# Patient Record
Sex: Male | Born: 1945 | Hispanic: No | Marital: Single | Smoking: Never smoker
Health system: Southern US, Community
[De-identification: ages and names within clinical notes are randomized; demographics above are authoritative.]

## PROBLEM LIST (undated history)

## (undated) DIAGNOSIS — I639 Cerebral infarction, unspecified: Secondary | ICD-10-CM

## (undated) DIAGNOSIS — E7800 Pure hypercholesterolemia, unspecified: Secondary | ICD-10-CM

## (undated) HISTORY — DX: Cerebral infarction, unspecified: I63.9

---

## 2000-08-12 ENCOUNTER — Emergency Department (HOSPITAL_COMMUNITY): Admission: EM | Admit: 2000-08-12 | Discharge: 2000-08-13 | Payer: Self-pay | Admitting: Emergency Medicine

## 2003-07-06 ENCOUNTER — Ambulatory Visit (HOSPITAL_COMMUNITY): Admission: RE | Admit: 2003-07-06 | Discharge: 2003-07-06 | Payer: Self-pay | Admitting: Family Medicine

## 2009-12-11 ENCOUNTER — Encounter: Admission: RE | Admit: 2009-12-11 | Discharge: 2009-12-11 | Payer: Self-pay | Admitting: Otolaryngology

## 2013-04-13 DIAGNOSIS — Z0000 Encounter for general adult medical examination without abnormal findings: Secondary | ICD-10-CM | POA: Diagnosis not present

## 2013-04-13 DIAGNOSIS — E785 Hyperlipidemia, unspecified: Secondary | ICD-10-CM | POA: Diagnosis not present

## 2013-04-13 DIAGNOSIS — K219 Gastro-esophageal reflux disease without esophagitis: Secondary | ICD-10-CM | POA: Diagnosis not present

## 2013-04-13 DIAGNOSIS — Z125 Encounter for screening for malignant neoplasm of prostate: Secondary | ICD-10-CM | POA: Diagnosis not present

## 2013-07-21 DIAGNOSIS — E785 Hyperlipidemia, unspecified: Secondary | ICD-10-CM | POA: Diagnosis not present

## 2014-07-06 DIAGNOSIS — Z125 Encounter for screening for malignant neoplasm of prostate: Secondary | ICD-10-CM | POA: Diagnosis not present

## 2014-07-06 DIAGNOSIS — D696 Thrombocytopenia, unspecified: Secondary | ICD-10-CM | POA: Diagnosis not present

## 2014-07-06 DIAGNOSIS — Z23 Encounter for immunization: Secondary | ICD-10-CM | POA: Diagnosis not present

## 2014-07-06 DIAGNOSIS — E780 Pure hypercholesterolemia: Secondary | ICD-10-CM | POA: Diagnosis not present

## 2014-07-06 DIAGNOSIS — Z0000 Encounter for general adult medical examination without abnormal findings: Secondary | ICD-10-CM | POA: Diagnosis not present

## 2014-07-06 DIAGNOSIS — K219 Gastro-esophageal reflux disease without esophagitis: Secondary | ICD-10-CM | POA: Diagnosis not present

## 2014-10-18 DIAGNOSIS — E782 Mixed hyperlipidemia: Secondary | ICD-10-CM | POA: Diagnosis not present

## 2014-11-07 ENCOUNTER — Encounter (HOSPITAL_COMMUNITY): Payer: Self-pay | Admitting: *Deleted

## 2014-11-07 ENCOUNTER — Inpatient Hospital Stay (HOSPITAL_COMMUNITY)
Admission: EM | Admit: 2014-11-07 | Discharge: 2014-11-09 | DRG: 066 | Disposition: A | Discharge status: Home / Self Care | Payer: Medicare Other | Attending: Internal Medicine | Admitting: Internal Medicine

## 2014-11-07 ENCOUNTER — Emergency Department (HOSPITAL_COMMUNITY): Payer: Medicare Other

## 2014-11-07 ENCOUNTER — Inpatient Hospital Stay (HOSPITAL_COMMUNITY): Payer: Medicare Other

## 2014-11-07 DIAGNOSIS — R739 Hyperglycemia, unspecified: Secondary | ICD-10-CM | POA: Diagnosis present

## 2014-11-07 DIAGNOSIS — I639 Cerebral infarction, unspecified: Principal | ICD-10-CM | POA: Diagnosis present

## 2014-11-07 DIAGNOSIS — R29810 Facial weakness: Secondary | ICD-10-CM | POA: Diagnosis present

## 2014-11-07 DIAGNOSIS — W19XXXA Unspecified fall, initial encounter: Secondary | ICD-10-CM | POA: Diagnosis not present

## 2014-11-07 DIAGNOSIS — Z9109 Other allergy status, other than to drugs and biological substances: Secondary | ICD-10-CM

## 2014-11-07 DIAGNOSIS — E7800 Pure hypercholesterolemia, unspecified: Secondary | ICD-10-CM | POA: Diagnosis present

## 2014-11-07 DIAGNOSIS — I6789 Other cerebrovascular disease: Secondary | ICD-10-CM | POA: Diagnosis not present

## 2014-11-07 DIAGNOSIS — I10 Essential (primary) hypertension: Secondary | ICD-10-CM | POA: Diagnosis present

## 2014-11-07 DIAGNOSIS — I63312 Cerebral infarction due to thrombosis of left middle cerebral artery: Secondary | ICD-10-CM | POA: Diagnosis not present

## 2014-11-07 DIAGNOSIS — E785 Hyperlipidemia, unspecified: Secondary | ICD-10-CM | POA: Diagnosis present

## 2014-11-07 DIAGNOSIS — I6621 Occlusion and stenosis of right posterior cerebral artery: Secondary | ICD-10-CM | POA: Diagnosis not present

## 2014-11-07 DIAGNOSIS — I739 Peripheral vascular disease, unspecified: Secondary | ICD-10-CM | POA: Diagnosis present

## 2014-11-07 DIAGNOSIS — I6611 Occlusion and stenosis of right anterior cerebral artery: Secondary | ICD-10-CM | POA: Diagnosis not present

## 2014-11-07 HISTORY — DX: Pure hypercholesterolemia, unspecified: E78.00

## 2014-11-07 LAB — CBC
HEMATOCRIT: 48.7 % (ref 39.0–52.0)
HEMOGLOBIN: 16.5 g/dL (ref 13.0–17.0)
MCH: 31.6 pg (ref 26.0–34.0)
MCHC: 33.9 g/dL (ref 30.0–36.0)
MCV: 93.3 fL (ref 78.0–100.0)
Platelets: 141 10*3/uL — ABNORMAL LOW (ref 150–400)
RBC: 5.22 MIL/uL (ref 4.22–5.81)
RDW: 12.7 % (ref 11.5–15.5)
WBC: 4.7 10*3/uL (ref 4.0–10.5)

## 2014-11-07 LAB — DIFFERENTIAL
BASOS ABS: 0 10*3/uL (ref 0.0–0.1)
Basophils Relative: 0 % (ref 0–1)
EOS ABS: 0 10*3/uL (ref 0.0–0.7)
Eosinophils Relative: 1 % (ref 0–5)
LYMPHS ABS: 1.1 10*3/uL (ref 0.7–4.0)
LYMPHS PCT: 23 % (ref 12–46)
Monocytes Absolute: 0.2 10*3/uL (ref 0.1–1.0)
Monocytes Relative: 5 % (ref 3–12)
NEUTROS ABS: 3.3 10*3/uL (ref 1.7–7.7)
NEUTROS PCT: 71 % (ref 43–77)

## 2014-11-07 LAB — I-STAT TROPONIN, ED: Troponin i, poc: 0 ng/mL (ref 0.00–0.08)

## 2014-11-07 LAB — COMPREHENSIVE METABOLIC PANEL
ALBUMIN: 4.4 g/dL (ref 3.5–5.0)
ALT: 35 U/L (ref 17–63)
AST: 23 U/L (ref 15–41)
Alkaline Phosphatase: 73 U/L (ref 38–126)
Anion gap: 9 (ref 5–15)
BILIRUBIN TOTAL: 0.7 mg/dL (ref 0.3–1.2)
BUN: 11 mg/dL (ref 6–20)
CO2: 27 mmol/L (ref 22–32)
CREATININE: 0.84 mg/dL (ref 0.61–1.24)
Calcium: 9.7 mg/dL (ref 8.9–10.3)
Chloride: 104 mmol/L (ref 101–111)
GFR calc Af Amer: >60 mL/min (ref >60)
GFR calc non Af Amer: >60 mL/min (ref >60)
GLUCOSE: 141 mg/dL — AB (ref 65–99)
POTASSIUM: 3.8 mmol/L (ref 3.5–5.1)
Sodium: 140 mmol/L (ref 135–145)
TOTAL PROTEIN: 6.7 g/dL (ref 6.5–8.1)

## 2014-11-07 LAB — I-STAT CHEM 8, ED
BUN: 14 mg/dL (ref 6–20)
CREATININE: 0.8 mg/dL (ref 0.61–1.24)
Calcium, Ion: 1.23 mmol/L (ref 1.13–1.30)
Chloride: 101 mmol/L (ref 101–111)
Glucose, Bld: 139 mg/dL — ABNORMAL HIGH (ref 65–99)
HEMATOCRIT: 49 % (ref 39.0–52.0)
Hemoglobin: 16.7 g/dL (ref 13.0–17.0)
POTASSIUM: 3.9 mmol/L (ref 3.5–5.1)
Sodium: 140 mmol/L (ref 135–145)
TCO2: 26 mmol/L (ref 0–100)

## 2014-11-07 LAB — PROTIME-INR
INR: 1.02 (ref 0.00–1.49)
PROTHROMBIN TIME: 13.6 s (ref 11.6–15.2)

## 2014-11-07 LAB — APTT: aPTT: 27 seconds (ref 24–37)

## 2014-11-07 MED ORDER — ENOXAPARIN SODIUM 40 MG/0.4ML ~~LOC~~ SOLN
40.0000 mg | SUBCUTANEOUS | Status: DC
  Administered 2014-11-07 – 2014-11-08 (×2): 40 mg via SUBCUTANEOUS
  Filled 2014-11-07 (×2): qty 0.4

## 2014-11-07 MED ORDER — STROKE: EARLY STAGES OF RECOVERY BOOK
Freq: Once | Status: AC
  Administered 2014-11-07: 18:00:00

## 2014-11-07 MED ORDER — ASPIRIN 325 MG PO TABS: 325.0000 mg | ORAL_TABLET | Freq: Every day | ORAL | Status: DC

## 2014-11-07 MED ORDER — PNEUMOCOCCAL VAC POLYVALENT 25 MCG/0.5ML IJ INJ
0.5000 mL | INJECTION | INTRAMUSCULAR | Status: DC
  Filled 2014-11-07: qty 0.5

## 2014-11-07 MED ORDER — ACETAMINOPHEN 650 MG RE SUPP: 650.0000 mg | RECTAL | Status: DC | PRN

## 2014-11-07 MED ORDER — ASPIRIN 300 MG RE SUPP
300.0000 mg | Freq: Every day | RECTAL | Status: DC
  Administered 2014-11-07 – 2014-11-08 (×2): 300 mg via RECTAL
  Filled 2014-11-07 (×2): qty 1

## 2014-11-07 MED ORDER — ACETAMINOPHEN 325 MG PO TABS: 650.0000 mg | ORAL_TABLET | ORAL | Status: DC | PRN

## 2014-11-07 NOTE — ED Provider Notes (Signed)
CSN: [id number]     Arrival date & time 11/07/14  0945 History   First MD Initiated Contact with Patient 11/07/14 1049     Chief Complaint  Patient presents with  . Stroke Symptoms     (Consider location/radiation/quality/duration/timing/severity/associated sxs/prior Treatment) HPI Comments: Patient presents to the ER for evaluation of possible stroke symptoms. Patient has been experiencing symptoms for 3-5 days. Over this period of time his wife has noticed that the right side of his face was drooping and he has had some slurred speech. He reports that he has to concentrate when he swallows because sometimes he chokes. He also has noticed difficulty writing. He does not perceive any weakness of his extremities, but he is having difficulty with fine motor function such as writing. He has not noticed any sensory changes in his extremities.   Past Medical History  Diagnosis Date  . High cholesterol    History reviewed. No pertinent past surgical history. History reviewed. No pertinent family history. Social History  Substance Use Topics  . Smoking status: Never Smoker   . Smokeless tobacco: None  . Alcohol Use: No    Review of Systems  Neurological: Positive for facial asymmetry.  All other systems reviewed and are negative.     Allergies  Lipitor  Home Medications   Prior to Admission medications   Not on File   BP 144/75 mmHg  Pulse 58  Temp(Src) 98.2 F (36.8 C) (Oral)  Resp 11  Ht  (1.651 m)  Wt 165 lb 14.4 oz (75.252 kg)  BMI 27.61 kg/m2  SpO2 99% Physical Exam  Constitutional: He is oriented to person, place, and time. He appears well-developed and well-nourished. No distress.  HENT:  Head: Normocephalic and atraumatic.  Right Ear: Hearing normal.  Left Ear: Hearing normal.  Nose: Nose normal.  Mouth/Throat: Oropharynx is clear and moist and mucous membranes are normal.  Eyes: Conjunctivae and EOM are normal. Pupils are equal, round, and reactive to  light.  Neck: Normal range of motion. Neck supple.  Cardiovascular: Regular rhythm, S1 normal and S2 normal.  Exam reveals no gallop and no friction rub.   No murmur heard. Pulmonary/Chest: Effort normal and breath sounds normal. No respiratory distress. He exhibits no tenderness.  Abdominal: Soft. Normal appearance and bowel sounds are normal. There is no hepatosplenomegaly. There is no tenderness. There is no rebound, no guarding, no tenderness at McBurney's point and negative Murphy's sign. No hernia.  Musculoskeletal: Normal range of motion.  Neurological: He is alert and oriented to person, place, and time. He has normal strength. A cranial nerve deficit is present. No sensory deficit. Coordination normal. GCS eye subscore is 4. GCS verbal subscore is 5. GCS motor subscore is 6.  Right facial droop Tongue midline  Extraocular muscle movement: normal No visual field cut Pupils: equal and reactive both direct and consensual response is normal No nystagmus present    Sensory function is intact to light touch, pinprick Proprioception intact  Grip strength 5/5 symmetric in upper extremities No pronator drift Normal finger to nose bilaterally  Lower extremity strength 5/5 against gravity Normal heel to shin bilaterally  Gait: normal   Skin: Skin is warm, dry and intact. No rash noted. No cyanosis.  Psychiatric: He has a normal mood and affect. His speech is normal and behavior is normal. Thought content normal.  Nursing note and vitals reviewed.   ED Course  Procedures (including critical care time) Labs Review Labs Reviewed  CBC -  Abnormal; Notable for the following:    Platelets 141 (*)    All other components within normal limits  COMPREHENSIVE METABOLIC PANEL - Abnormal; Notable for the following:    Glucose, Bld 141 (*)    All other components within normal limits  I-STAT CHEM 8, ED - Abnormal; Notable for the following:    Glucose, Bld 139 (*)    All other components  within normal limits  PROTIME-INR  APTT  DIFFERENTIAL  Rosezena Sensor, ED    Imaging Review Mr Brain Wo Contrast  11/07/2014   CLINICAL DATA:  69 year old male with high cholesterol presenting with slurred speech, facial droop and difficulty writing for 3-4 days. Initial encounter.  EXAM: MRI HEAD WITHOUT CONTRAST  TECHNIQUE: Multiplanar, multiecho pulse sequences of the brain and surrounding structures were obtained without intravenous contrast.  COMPARISON:  None.  FINDINGS: Acute nonhemorrhagic infarct mid left corona radiata extending to superior aspect of the left lenticular nucleus.  Scattered punctate and patchy white matter type changes most consistent with result small vessel disease.  Mild global atrophy. Ventricular prominence may be related to atrophy although cannot exclude a mild component of hydrocephalus. The aqueduct is patent.  No intracranial mass lesion noted on this unenhanced exam.  No intracranial hemorrhage.  Major intracranial vascular structures are patent. Suggestion of dilated right internal carotid artery cavernous segment.  Cervical medullary junction, pituitary region, pineal region and orbital structures unremarkable.  Mild mucosal thickening inferior aspect maxillary sinuses. Minimal mucosal thickening ethmoid sinus air cells.  IMPRESSION: Acute nonhemorrhagic infarct mid left corona radiata extending to superior aspect of the left lenticular nucleus.  Scattered punctate and patchy white matter type changes most consistent with result small vessel disease.  Mild global atrophy. Ventricular prominence may be related to atrophy although cannot exclude a mild component of hydrocephalus. The aqueduct is patent.  Major intracranial vascular structures are patent. Suggestion of dilated right internal carotid artery cavernous segment.  Mild mucosal thickening inferior aspect maxillary sinuses. Minimal mucosal thickening ethmoid sinus air cells.   Electronically Signed   By:  Lacy Duverney M.D.   On: 11/07/2014 13:39   I have personally reviewed and evaluated these images and lab results as part of my medical decision-making.   EKG Interpretation None      MDM   Final diagnoses:  CVA (cerebral vascular accident)    Presents to the ER for evaluation of right facial droop and right upper extremity coordination problems. Symptoms ongoing for several days, possibly up to a week. He does have subtle right facial drooping on examination. He reports some difficulty with swallowing. He did not have any other objective findings. MRI reveals acute CVA.    Gilda Crease, MD 11/08/14 [phone]

## 2014-11-07 NOTE — Progress Notes (Signed)
Patient arrived to 5M18. Patient is alert and oriented x4, appropriate cognition, denies pain. Skin intact, no skin issues. VSS. SLP bedside swallow evaluation placed, per ED RN, patient failed RN stroke swallow screen due to occasionally choking on fluids in past week. Patient's family at bedside, oriented to room, unit, staff. Q2 neuro checks and vitals started. Will continue to monitor. Safety measures in place.

## 2014-11-07 NOTE — H&P (Signed)
[facility] History and Physical  Livio Ledwith HKG:[id number] DOB: [birth year] DOA: 11/07/2014   PCP: Vena Austria, MD  Specialists: None  Chief Complaint: Difficulty with speaking and difficulty with swallowing liquids since last week  HPI: Thomas French is a 69 y.o. male with a past medical history of hypercholesterolemia, who denies any other health problems and was in his usual state of health till sometime last week when he noticed that he was not able to speak as fluently as he was before. He had difficulty speaking certain words. He also noticed that his handwriting was not his usual. He was writing erratically. And then his wife also noticed that his face looked asymmetrical. Patient also noticed that he was having difficulty swallowing liquids, especially when he would try to swallow them quickly. He decided to wait out the symptoms and called his primary care physician today. He was asked to come in to the emergency department for further evaluation. He denies any headaches, chest pain, shortness of breath, nausea, vomiting. Denies any weakness in his arms or legs. He's never had similar symptoms before. He feels as if his speech has improved but not quite back to his baseline.  Home Medications: Prior to Admission medications   Medication Sig Start Date End Date Taking? Authorizing Provider  pravastatin (PRAVACHOL) 40 MG tablet Take 40 mg by mouth daily.   Yes Historical Provider, MD    Allergies:  Allergies  Allergen Reactions  . Lipitor [Atorvastatin]     Past Medical History: Past Medical History  Diagnosis Date  . High cholesterol     History reviewed. No pertinent past surgical history.  Social History: Lives in [place] with his wife. Denies smoking. Occasional alcohol use. No illicit drug use. 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 Usually independent with daily activities.   Family History:  Family History  Problem Relation Age  of Onset  . Diabetes Father      Review of Systems - History obtained from the patient General ROS: positive for  - fatigue Psychological ROS: negative Ophthalmic ROS: negative ENT ROS: negative Allergy and Immunology ROS: negative Hematological and Lymphatic ROS: negative Endocrine ROS: negative Respiratory ROS: no cough, shortness of breath, or wheezing Cardiovascular ROS: no chest pain or dyspnea on exertion Gastrointestinal ROS: no abdominal pain, change in bowel habits, or black or bloody stools Genito-Urinary ROS: no dysuria, trouble voiding, or hematuria Musculoskeletal ROS: negative Neurological ROS: as in hpi Dermatological ROS: negative  Physical Examination  Filed Vitals:   11/07/14 1415 11/07/14 1430 11/07/14 1500 11/07/14 1515  BP: 135/77 148/69 141/76 151/80  Pulse: 55 54 53 55  Temp:      TempSrc:      Resp: _0 Height:      Weight:      SpO2: 98% 98% 99% 98%    BP 151/80 mmHg  Pulse 55  Temp(Src) 98.2 F (36.8 C) (Oral)  Resp 16  Ht 5' 5" (1.651 m)  Wt 75.252 kg (165 lb 14.4 oz)  BMI 27.61 kg/m2  SpO2 98%  General appearance: alert, cooperative, appears stated age and no distress Head: Normocephalic, without obvious abnormality, atraumatic Eyes: conjunctivae/corneas clear. PERRL, EOM's intact.  Throat: lips, mucosa, and tongue normal; teeth and gums normal Neck: no adenopathy, no carotid bruit, no JVD, supple, symmetrical, trachea midline and thyroid not enlarged, symmetric, no tenderness/mass/nodules Resp: clear to auscultation bilaterally Cardio: regular rate and rhythm, S1, S2 normal, no murmur, click,  rub or gallop GI: soft, non-tender; bowel sounds normal; no masses,  no organomegaly Extremities: extremities normal, atraumatic, no cyanosis or edema Pulses: 2+ and symmetric Skin: Skin color, texture, turgor normal. No rashes or lesions Lymph nodes: Cervical, supraclavicular, and axillary nodes normal. Neurologic: Alert and oriented  3. Right facial droop is noted. Other cranial nerves are intact. No pronator drift. Motor strength is 55 bilateral upper and lower extremities. Gait not assessed. Sensation is intact.  Laboratory Data: Results for orders placed or performed during the hospital encounter of 11/07/14 (from the past 48 hour(s))  I-stat troponin, ED (not at [facility], [facility])     Status: None   Collection Time: 11/07/14 10:58 AM  Result Value Ref Range   Troponin i, poc 0.00 0.00 - 0.08 ng/mL   Comment 3            Comment: Due to the release kinetics of cTnI, a negative result within the first hours of the onset of symptoms does not rule out myocardial infarction with certainty. If myocardial infarction is still suspected, repeat the test at appropriate intervals.   Protime-INR     Status: None   Collection Time: 11/07/14 10:59 AM  Result Value Ref Range   Prothrombin Time 13.6 11.6 - 15.2 seconds   INR 1.02 0.00 - 1.49  APTT     Status: None   Collection Time: 11/07/14 10:59 AM  Result Value Ref Range   aPTT 27 24 - 37 seconds  CBC     Status: Abnormal   Collection Time: 11/07/14 10:59 AM  Result Value Ref Range   WBC 4.7 4.0 - 10.5 K/uL   RBC 5.22 4.22 - 5.81 MIL/uL   Hemoglobin 16.5 13.0 - 17.0 g/dL   HCT 48.7 39.0 - 52.0 %   MCV 93.3 78.0 - 100.0 fL   MCH 31.6 26.0 - 34.0 pg   MCHC 33.9 30.0 - 36.0 g/dL   RDW 12.7 11.5 - 15.5 %   Platelets 141 (L) 150 - 400 K/uL  Differential     Status: None   Collection Time: 11/07/14 10:59 AM  Result Value Ref Range   Neutrophils Relative % 71 43 - 77 %   Neutro Abs 3.3 1.7 - 7.7 K/uL   Lymphocytes Relative 23 12 - 46 %   Lymphs Abs 1.1 0.7 - 4.0 K/uL   Monocytes Relative 5 3 - 12 %   Monocytes Absolute 0.2 0.1 - 1.0 K/uL   Eosinophils Relative 1 0 - 5 %   Eosinophils Absolute 0.0 0.0 - 0.7 K/uL   Basophils Relative 0 0 - 1 %   Basophils Absolute 0.0 0.0 - 0.1 K/uL  Comprehensive metabolic panel     Status: Abnormal   Collection Time: 11/07/14 10:59 AM   Result Value Ref Range   Sodium 140 135 - 145 mmol/L   Potassium 3.8 3.5 - 5.1 mmol/L   Chloride 104 101 - 111 mmol/L   CO2 27 22 - 32 mmol/L   Glucose, Bld 141 (H) 65 - 99 mg/dL   BUN 11 6 - 20 mg/dL   Creatinine, Ser 0.84 0.61 - 1.24 mg/dL   Calcium 9.7 8.9 - 10.3 mg/dL   Total Protein 6.7 6.5 - 8.1 g/dL   Albumin 4.4 3.5 - 5.0 g/dL   AST 23 15 - 41 U/L   ALT 35 17 - 63 U/L   Alkaline Phosphatase 73 38 - 126 U/L   Total Bilirubin 0.7 0.3 - 1.2 mg/dL  GFR calc non Af Amer >60 >60 mL/min   GFR calc Af Amer >60 >60 mL/min    Comment: (NOTE) The eGFR has been calculated using the CKD EPI equation. This calculation has not been validated in all clinical situations. eGFR's persistently <60 mL/min signify possible Chronic Kidney Disease.    Anion gap 9 5 - 15  I-Stat Chem 8, ED  (not at [facility], [address] [facility])     Status: Abnormal   Collection Time: 11/07/14 11:00 AM  Result Value Ref Range   Sodium 140 135 - 145 mmol/L   Potassium 3.9 3.5 - 5.1 mmol/L   Chloride 101 101 - 111 mmol/L   BUN 14 6 - 20 mg/dL   Creatinine, Ser 0.80 0.61 - 1.24 mg/dL   Glucose, Bld 139 (H) 65 - 99 mg/dL   Calcium, Ion 1.23 1.13 - 1.30 mmol/L   TCO2 26 0 - 100 mmol/L   Hemoglobin 16.7 13.0 - 17.0 g/dL   HCT 49.0 39.0 - 52.0 %    Radiology Reports: Mr Herby Abraham Contrast  11/07/2014   CLINICAL DATA:  70 year old male with high cholesterol presenting with slurred speech, facial droop and difficulty writing for 3-4 days. Initial encounter.  EXAM: MRI HEAD WITHOUT CONTRAST  TECHNIQUE: Multiplanar, multiecho pulse sequences of the brain and surrounding structures were obtained without intravenous contrast.  COMPARISON:  None.  FINDINGS: Acute nonhemorrhagic infarct mid left corona radiata extending to superior aspect of the left lenticular nucleus.  Scattered punctate and patchy white matter type changes most consistent with result small vessel disease.  Mild global atrophy. Ventricular prominence may be related to  atrophy although cannot exclude a mild component of hydrocephalus. The aqueduct is patent.  No intracranial mass lesion noted on this unenhanced exam.  No intracranial hemorrhage.  Major intracranial vascular structures are patent. Suggestion of dilated right internal carotid artery cavernous segment.  Cervical medullary junction, pituitary region, pineal region and orbital structures unremarkable.  Mild mucosal thickening inferior aspect maxillary sinuses. Minimal mucosal thickening ethmoid sinus air cells.  IMPRESSION: Acute nonhemorrhagic infarct mid left corona radiata extending to superior aspect of the left lenticular nucleus.  Scattered punctate and patchy white matter type changes most consistent with result small vessel disease.  Mild global atrophy. Ventricular prominence may be related to atrophy although cannot exclude a mild component of hydrocephalus. The aqueduct is patent.  Major intracranial vascular structures are patent. Suggestion of dilated right internal carotid artery cavernous segment.  Mild mucosal thickening inferior aspect maxillary sinuses. Minimal mucosal thickening ethmoid sinus air cells.   Electronically Signed   By: Genia Del M.D.   On: 11/07/2014 13:39    My interpretation of Electrocardiogram: Sinus bradycardia at 56 beats a minute. Normal axis. Intervals are normal. T inversion in V3. Flattening in the inferior leads. No ischemic changes. No older EKGs available for comparison.  Problem List  Principal Problem:   CVA (cerebral vascular accident) Active Problems:   Hypercholesteremia   Assessment: This is a 69 year old male of [place] origin. Originally from [place]. Presents with difficulty speaking, difficulty swallowing. Facial asymmetry. He is noted to have an acute nonhemorrhagic infarct in the mid left corona radiata extending to the superior aspect of the left lenticular nucleus. He will be hospitalized for further management of stroke. He is outside the  window of tPA as his symptom onset was last week.  Plan: #1 Acute stroke: Stroke workup will be initiated including MRA head, echocardiogram, carotid Dopplers, lipid panel will be checked.  PT and OT evaluation. Speech therapy to see both her swallow as well as speech. Neurology has been consulted. Patient will be started on aspirin. He denies taking any aspirin at home.  #2 Hyperglycemia: This is a nonfasting level. Fasting level will be checked tomorrow morning. HbA1c will be checked.  #3 history of hypercholesterolemia: Patient is on pravastatin. He hasn't taken it in the last 3 days. Lipid panel will be ordered.   DVT Prophylaxis: Lovenox Code Status: Full code Family Communication: Discussed with the patient  Disposition Plan: Admit to telemetry   Further management decisions will depend on results of further testing and patient's response to treatment.   [facility]  [facility] Pager [phone]  If 7PM-7AM, please contact night-coverage [email or web address] Password [facility]  11/07/2014, 3:41 PM

## 2014-11-07 NOTE — ED Notes (Signed)
Patient returned from MRI.

## 2014-11-07 NOTE — ED Notes (Signed)
Admit Doctor at bedside.  

## 2014-11-07 NOTE — Consult Note (Signed)
Admission H&P    Chief Complaint: Right facial droop and slight speech difficulty.  HPI: Thomas French is an 69 y.o. male with a history of elevated cholesterol presenting with new onset right facial droop and slightly slurred speech as well as slight writing difficulty. Patient's wife noticed facial droop about 5 days ago. Patient has noticed that his speech is not as fluent as usual, and his handwriting is somewhat primitive compared to his usual penmanship. 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 He has not been on antiplatelets therapy. There's no previous history of stroke nor TIA. Stroke score at the time of this evaluation was 1.  LSN: 11/02/2014 tPA Given: No: Beyond time window for treatment consideration mRankin:  Past Medical History  Diagnosis Date  . High cholesterol     History reviewed. No pertinent past surgical history.  Family History  Problem Relation Age of Onset  . Diabetes Father    Social History:  reports that he has never smoked. He does not have any smokeless tobacco history on file. He reports that he does not drink alcohol or use illicit drugs.  Allergies:  Allergies  Allergen Reactions  . Lipitor [Atorvastatin]     Medications: Patient's preadmission medications were reviewed by me.  ROS: History obtained from the patient  General ROS: negative for - chills, fatigue, fever, night sweats, weight gain or weight loss Psychological ROS: negative for - behavioral disorder, hallucinations, memory difficulties, mood swings or suicidal ideation Ophthalmic ROS: negative for - blurry vision, double vision, eye pain or loss of vision ENT ROS: negative for - epistaxis, nasal discharge, oral lesions, sore throat, tinnitus or vertigo Allergy and Immunology ROS: negative for - hives or itchy/watery eyes Hematological and Lymphatic  ROS: negative for - bleeding problems, bruising or swollen lymph nodes Endocrine ROS: negative for - galactorrhea, hair pattern changes, polydipsia/polyuria or temperature intolerance Respiratory ROS: negative for - cough, hemoptysis, shortness of breath or wheezing Cardiovascular ROS: negative for - chest pain, dyspnea on exertion, edema or irregular heartbeat Gastrointestinal ROS: negative for - abdominal pain, diarrhea, hematemesis, nausea/vomiting or stool incontinence Genito-Urinary ROS: negative for - dysuria, hematuria, incontinence or urinary frequency/urgency Musculoskeletal ROS: negative for - joint swelling or muscular weakness Neurological ROS: as noted in HPI Dermatological ROS: negative for rash and skin lesion changes  Physical Examination: Blood pressure 151/80, pulse 55, temperature 98.2 F (36.8 C), temperature source Oral, resp. rate 16, height 5' 5"  (1.651 m), weight 75.252 kg (165 lb 14.4 oz), SpO2 98 %.  HEENT-  Normocephalic, no lesions, without obvious abnormality.  Normal external eye and conjunctiva.  Normal TM's bilaterally.  Normal auditory canals and external ears. Normal external nose, mucus membranes and septum.  Normal pharynx. Neck supple with no masses, nodes, nodules or enlargement. Cardiovascular - regular rate and rhythm, S1, S2 normal, no murmur, click, rub or gallop Lungs - chest clear, no wheezing, rales, normal symmetric air entry Abdomen - soft, non-tender; bowel sounds normal; no masses,  no organomegaly Extremities - no joint deformities, effusion, or inflammation, no edema and no skin discoloration  Neurologic Examination: Mental Status: Alert, oriented, thought content appropriate.  Speech fluent without evidence of aphasia. Able to follow commands without difficulty. Cranial Nerves: II-Visual fields were normal. III/IV/VI-Pupils were equal and reacted normally to light. Extraocular movements were full and conjugate.    V/VII-no facial  numbness; mild to moderate  right lower facial weakness. VIII-normal. X-normal speech and symmetrical palatal movement. XI: trapezius strength/neck flexion strength normal bilaterally XII-midline tongue extension with normal strength. Motor: 5/5 bilaterally with normal tone and bulk Sensory: Normal throughout. Deep Tendon Reflexes: 1+ and symmetric. Plantars: Mute bilaterally Cerebellar: Normal finger-to-nose testing. Carotid auscultation: Normal  Results for orders placed or performed during the hospital encounter of 11/07/14 (from the past 48 hour(s))  I-stat troponin, ED (not at [facility], [facility])     Status: None   Collection Time: 11/07/14 10:58 AM  Result Value Ref Range   Troponin i, poc 0.00 0.00 - 0.08 ng/mL   Comment 3            Comment: Due to the release kinetics of cTnI, a negative result within the first hours of the onset of symptoms does not rule out myocardial infarction with certainty. If myocardial infarction is still suspected, repeat the test at appropriate intervals.   Protime-INR     Status: None   Collection Time: 11/07/14 10:59 AM  Result Value Ref Range   Prothrombin Time 13.6 11.6 - 15.2 seconds   INR 1.02 0.00 - 1.49  APTT     Status: None   Collection Time: 11/07/14 10:59 AM  Result Value Ref Range   aPTT 27 24 - 37 seconds  CBC     Status: Abnormal   Collection Time: 11/07/14 10:59 AM  Result Value Ref Range   WBC 4.7 4.0 - 10.5 K/uL   RBC 5.22 4.22 - 5.81 MIL/uL   Hemoglobin 16.5 13.0 - 17.0 g/dL   HCT 48.7 39.0 - 52.0 %   MCV 93.3 78.0 - 100.0 fL   MCH 31.6 26.0 - 34.0 pg   MCHC 33.9 30.0 - 36.0 g/dL   RDW 12.7 11.5 - 15.5 %   Platelets 141 (L) 150 - 400 K/uL  Differential     Status: None   Collection Time: 11/07/14 10:59 AM  Result Value Ref Range   Neutrophils Relative % 71 43 - 77 %   Neutro Abs 3.3 1.7 - 7.7 K/uL   Lymphocytes Relative 23 12 - 46 %   Lymphs Abs 1.1 0.7 - 4.0 K/uL   Monocytes Relative 5 3 - 12 %   Monocytes Absolute  0.2 0.1 - 1.0 K/uL   Eosinophils Relative 1 0 - 5 %   Eosinophils Absolute 0.0 0.0 - 0.7 K/uL   Basophils Relative 0 0 - 1 %   Basophils Absolute 0.0 0.0 - 0.1 K/uL  Comprehensive metabolic panel     Status: Abnormal   Collection Time: 11/07/14 10:59 AM  Result Value Ref Range   Sodium 140 135 - 145 mmol/L   Potassium 3.8 3.5 - 5.1 mmol/L   Chloride 104 101 - 111 mmol/L   CO2 27 22 - 32 mmol/L   Glucose, Bld 141 (H) 65 - 99 mg/dL   BUN 11 6 - 20 mg/dL   Creatinine, Ser 0.84 0.61 - 1.24 mg/dL   Calcium 9.7 8.9 - 10.3 mg/dL   Total Protein 6.7 6.5 - 8.1 g/dL   Albumin 4.4 3.5 - 5.0 g/dL   AST 23 15 - 41 U/L   ALT 35 17 - 63 U/L   Alkaline Phosphatase 73 38 - 126 U/L   Total Bilirubin 0.7 0.3 - 1.2 mg/dL   GFR calc non Af Amer >60 >60 mL/min   GFR calc Af Amer >60 >60 mL/min    Comment: (NOTE) The eGFR has been calculated using  the CKD EPI equation. This calculation has not been validated in all clinical situations. eGFR's persistently <60 mL/min signify possible Chronic Kidney Disease.    Anion gap 9 5 - 15  I-Stat Chem 8, ED  (not at [facility], [facility])     Status: Abnormal   Collection Time: 11/07/14 11:00 AM  Result Value Ref Range   Sodium 140 135 - 145 mmol/L   Potassium 3.9 3.5 - 5.1 mmol/L   Chloride 101 101 - 111 mmol/L   BUN 14 6 - 20 mg/dL   Creatinine, Ser 0.80 0.61 - 1.24 mg/dL   Glucose, Bld 139 (H) 65 - 99 mg/dL   Calcium, Ion 1.23 1.13 - 1.30 mmol/L   TCO2 26 0 - 100 mmol/L   Hemoglobin 16.7 13.0 - 17.0 g/dL   HCT 49.0 39.0 - 52.0 %   Mr Brain Wo Contrast  11/07/2014   CLINICAL DATA:  69 year old male with high cholesterol presenting with slurred speech, facial droop and difficulty writing for 3-4 days. Initial encounter.  EXAM: MRI HEAD WITHOUT CONTRAST  TECHNIQUE: Multiplanar, multiecho pulse sequences of the brain and surrounding structures were obtained without intravenous contrast.  COMPARISON:  None.  FINDINGS: Acute nonhemorrhagic infarct mid left corona  radiata extending to superior aspect of the left lenticular nucleus.  Scattered punctate and patchy white matter type changes most consistent with result small vessel disease.  Mild global atrophy. Ventricular prominence may be related to atrophy although cannot exclude a mild component of hydrocephalus. The aqueduct is patent.  No intracranial mass lesion noted on this unenhanced exam.  No intracranial hemorrhage.  Major intracranial vascular structures are patent. Suggestion of dilated right internal carotid artery cavernous segment.  Cervical medullary junction, pituitary region, pineal region and orbital structures unremarkable.  Mild mucosal thickening inferior aspect maxillary sinuses. Minimal mucosal thickening ethmoid sinus air cells.  IMPRESSION: Acute nonhemorrhagic infarct mid left corona radiata extending to superior aspect of the left lenticular nucleus.  Scattered punctate and patchy white matter type changes most consistent with result small vessel disease.  Mild global atrophy. Ventricular prominence may be related to atrophy although cannot exclude a mild component of hydrocephalus. The aqueduct is patent.  Major intracranial vascular structures are patent. Suggestion of dilated right internal carotid artery cavernous segment.  Mild mucosal thickening inferior aspect maxillary sinuses. Minimal mucosal thickening ethmoid sinus air cells.   Electronically Signed   By: Genia Del M.D.   On: 11/07/2014 13:39    Assessment: 69 y.o. 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  Stroke Risk Factors - hyperlipidemia  Plan: 1. HgbA1c, fasting lipid panel 2. MRA  of the brain without contrast 3. PT consult, OT consult, Speech consult 4. Echocardiogram 5. Carotid dopplers 6. Prophylactic therapy-Antiplatelet med: Aspirin 7. Risk factor modification 8. Telemetry monitoring  C.R. Nicole Kindred, MD [facility] [phone] 11/07/2014, 4:06 PM

## 2014-11-07 NOTE — ED Notes (Signed)
Pt reports slurred speech, facial droop and difficulty writing x 3-4 days. Grips are equal at triage, facial droop noted. Pt denies unsteady gait or dizziness, ambulatory at triage.

## 2014-11-07 NOTE — ED Notes (Signed)
Called floor for report nurse assisting another patient will call back for report.  

## 2014-11-08 ENCOUNTER — Ambulatory Visit (HOSPITAL_COMMUNITY): Payer: Medicare Other

## 2014-11-08 DIAGNOSIS — I63312 Cerebral infarction due to thrombosis of left middle cerebral artery: Secondary | ICD-10-CM

## 2014-11-08 LAB — LIPID PANEL
CHOL/HDL RATIO: 4.9 ratio
CHOLESTEROL: 292 mg/dL — AB (ref 0–200)
HDL: 59 mg/dL (ref >40)
LDL Cholesterol: 173 mg/dL — ABNORMAL HIGH (ref 0–99)
Triglycerides: 298 mg/dL — ABNORMAL HIGH (ref <150)
VLDL: 60 mg/dL — AB (ref 0–40)

## 2014-11-08 MED ORDER — CLOPIDOGREL BISULFATE 75 MG PO TABS
75.0000 mg | ORAL_TABLET | Freq: Every day | ORAL | Status: DC
Start: 2014-11-09 — End: 2014-11-09
  Administered 2014-11-09: 75 mg via ORAL
  Filled 2014-11-08: qty 1

## 2014-11-08 MED ORDER — PRAVASTATIN SODIUM 40 MG PO TABS
40.0000 mg | ORAL_TABLET | Freq: Every day | ORAL | Status: DC
  Administered 2014-11-08: 40 mg via ORAL
  Filled 2014-11-08: qty 1

## 2014-11-08 NOTE — Progress Notes (Signed)
Thomas French fell in bathroom hit his head on bedside camode quarter size red spot on right lateral side of head vitals are stable and no pain. Vital signs 147/79, pulse 84, temp 97.7, [employer], Dr Dian Situ notified and he asked that he be notified if patient has any N/V, head ache or any other nero symptoms.

## 2014-11-08 NOTE — Evaluation (Signed)
Speech Language Pathology Evaluation Patient Details Name: Thomas French MRN: [id number] DOB: 10-Jan-1946 Today's Date: 11/08/2014 Time: 1012-1039 SLP Time Calculation (min) (ACUTE ONLY): 27 min  Problem List:  Patient Active Problem List   Diagnosis Date Noted  . CVA (cerebral vascular accident) 11/07/2014  . Hypercholesteremia 11/07/2014   Past Medical History:  Past Medical History  Diagnosis Date  . High cholesterol    Past Surgical History: History reviewed. No pertinent past surgical history. HPI:  69 y.o. male admitted with speech and swallowing changes.  MRI revealed acute nonhemorrhagic infarct mid left corona radiata extending to superior aspect of the left lenticular nucleus.     Assessment / Plan / Recommendation Clinical Impression  Pt presents with a mild right CN VII asymmetry which does not impact speech intelligibility - no further dysarthria.  Language and fluency are intact.  Reading/writing WNL; pt reports he is 95% of  baseline function.  No SLP f/u warranted.      SLP Assessment  Patient does not need any further Speech Lanaguage Pathology Services    Follow Up Recommendations    none   Frequency and Duration        Pertinent Vitals/Pain Pain Assessment: No/denies pain   SLP Goals     SLP Evaluation Prior Functioning  Cognitive/Linguistic Baseline: Within functional limits   Cognition  Overall Cognitive Status: Within Functional Limits for tasks assessed Orientation Level: Oriented X4    Comprehension  Auditory Comprehension Overall Auditory Comprehension: Appears within functional limits for tasks assessed Visual Recognition/Discrimination Discrimination: Within Function Limits Reading Comprehension Reading Status: Within funtional limits    Expression Expression Primary Mode of Expression: Verbal Verbal Expression Overall Verbal Expression: Appears within functional limits for tasks assessed Written Expression Dominant Hand:  Right Written Expression: Within Functional Limits   Oral / Motor Oral Motor/Sensory Function Overall Oral Motor/Sensory Function:  (mild right asymmetry) Motor Speech Overall Motor Speech: Appears within functional limits for tasks assessed   Thomas French L. Thomas French, [place] CCC/SLP Pager [phone]      Thomas French 11/08/2014, 10:43 AM

## 2014-11-08 NOTE — Evaluation (Signed)
Physical Therapy Evaluation and Discharge Patient Details Name: Thomas French MRN: [id number] DOB: June 09, 1945 Today's Date: 11/08/2014   History of Present Illness  Thomas French is a 69 y.o. male with a past medical history of hypercholesterolemia, who denies any other health problems and was in his usual state of health till sometime last week when he noticed that he was not able to speak as fluently as he was before. MRI reveal smal L BG with extension into CR.  Clinical Impression  Pt admitted with above. Pt functioning at modified independent with transfers, ambulation and stairs. Pt at minimal falls risk as demo'd by score of 23/24 on DGI. Aware pt had fall in bathroom however was due to a moving laundry cart in bathroom and pushing accidentally pushing on it to get up. Pt with 24/7 assist provided by wife upon d/c. Pt with no further acute PT needs at this time. PT SIGNING OFF. Please re-consult if needed in future.     Follow Up Recommendations No PT follow up    Equipment Recommendations  None recommended by PT    Recommendations for Other Services       Precautions / Restrictions Precautions Precautions: None Precaution Comments: pt did have a fall in bathroom however due to pt weight-bearing on rolling laundry cart Restrictions Weight Bearing Restrictions: No      Mobility  Bed Mobility Overal bed mobility: Modified Independent             General bed mobility comments: HOB elevated  Transfers Overall transfer level: Modified independent Equipment used: None             General transfer comment: used hands safely to push up  Ambulation/Gait Ambulation/Gait assistance: Modified independent (Device/Increase time) Ambulation Distance (Feet): 400 Feet Assistive device: None Gait Pattern/deviations: WFL(Within Functional Limits) Gait velocity: wfl Gait velocity interpretation: at or above normal speed for age/gender General Gait Details: no episodes of  LOB  Stairs Stairs: Yes Stairs assistance: Supervision Stair Management: One rail Right Number of Stairs: 12 General stair comments: safe technique, alternating step pattern  Wheelchair Mobility    Modified Rankin (Stroke Patients Only) Modified Rankin (Stroke Patients Only) Pre-Morbid Rankin Score: No symptoms Modified Rankin: No significant disability     Balance Overall balance assessment: Independent (did have fall in bathroom today)                               Standardized Balance Assessment Standardized Balance Assessment : Dynamic Gait Index   Dynamic Gait Index Level Surface: Normal Change in Gait Speed: Normal Gait with Horizontal Head Turns: Normal Gait with Vertical Head Turns: Normal Gait and Pivot Turn: Normal Step Over Obstacle: Normal Step Around Obstacles: Normal Steps: Mild Impairment Total Score: 23       Pertinent Vitals/Pain Pain Assessment: No/denies pain    Home Living Family/patient expects to be discharged to:: Private residence Living Arrangements: Spouse/significant other Available Help at Discharge: Family;Available 24 hours/day Type of Home: House Home Access: Stairs to enter Entrance Stairs-Rails: Right Entrance Stairs-Number of Steps: 3 Home Layout: Two level        Prior Function Level of Independence: Independent         Comments: retired     Higher education careers adviser Dominance   Dominant Hand: Right    Extremity/Trunk Assessment   Upper Extremity Assessment: Overall WFL for tasks assessed           Lower Extremity  Assessment: Overall WFL for tasks assessed      Cervical / Trunk Assessment: Normal  Communication   Communication: No difficulties  Cognition Arousal/Alertness: Awake/alert Behavior During Therapy: WFL for tasks assessed/performed Overall Cognitive Status: Within Functional Limits for tasks assessed                      General Comments      Exercises        Assessment/Plan     PT Assessment Patent does not need any further PT services  PT Diagnosis Difficulty walking   PT Problem List    PT Treatment Interventions     PT Goals (Current goals can be found in the Care Plan section) Acute Rehab PT Goals Patient Stated Goal: home PT Goal Formulation: All assessment and education complete, DC therapy    Frequency     Barriers to discharge        Co-evaluation               End of Session Equipment Utilized During Treatment: Gait belt Activity Tolerance: Patient tolerated treatment well Patient left: in bed;with call bell/phone within reach;with bed alarm set Nurse Communication: Mobility status         Time: 1610-9604 PT Time Calculation (min) (ACUTE ONLY): 17 min   Charges:   PT Evaluation $Initial PT Evaluation Tier I: 1 Procedure     PT G CodesMarcene Brawn 11/08/2014, 4:51 PM   Lewis Shock, PT, DPT Pager #: [phone] Office #: [phone]

## 2014-11-08 NOTE — Progress Notes (Addendum)
Was assisting pt to the bathroom, pt was in bathroom with the door closed, did not call out when finished. I heard a lot of noise and something hit the wall, I walked in to discover the patient sitting on the floor on his bottom. I asked him what happened and if he was okay. Pt responded with he was okay and was trying to prop himself up to pull up his underwear by steadying himself with the small soiled linen cart on wheels. Pt got himself up with the assistance of wife and this Clinical research associate. Pt placed back in bed, vital signs obtained, pt reported hitting the right side of his head with a small area of redness, pt denies any pain. Pt is also neurologically intact. Pt was wearing non-skid footwear. Bed alarm placed on patient. Assigned floor RN notified as well as Counsellor. Will continue to monitor patient.   Donell Beers, [employer], SN   I have seen this patient and assisted with note.  Christena Deem, MSN, [facility] [employer] Faculty

## 2014-11-08 NOTE — Progress Notes (Signed)
[facility] PROGRESS NOTE  Thomas French NWG:[id number] DOB: 04/03/1945 DOA: 11/07/2014  PCP: Lolita Patella, MD  Brief HPI: 69 year old male originally from [place] presented with a one-week history of right facial droop and slurred speech. He was found to have an acute stroke. He was hospitalized for further management.  Past medical history:  Past Medical History  Diagnosis Date  . High cholesterol     Consultants: Neurology  Procedures:  2-D echocardiogram is pending Carotid Doppler is pending  Antibiotics: None  Subjective: Patient feels better this morning. States that he is able to speak better than what he was able to do the last few days. His handwriting is improved. Denies any dizziness, lightheadedness, headaches.  Objective: Vital Signs  Filed Vitals:   11/08/14 0400 11/08/14 0600 11/08/14 0800 11/08/14 0941  BP: 113/75 127/76 159/84 141/82  Pulse: 55 55 55 60  Temp: 98.4 F (36.9 C) 97.9 F (36.6 C) 98.2 F (36.8 C) 98.1 F (36.7 C)  TempSrc: Oral Oral Oral Oral  Resp: 14 14 16 18   Height:      Weight:      SpO2: 98% 97% 97% 96%    Intake/Output Summary (Last 24 hours) at 11/08/14 1051 Last data filed at 11/08/14 0843  Gross per 24 hour  Intake      0 ml  Output      0 ml  Net      0 ml   Filed Weights   11/07/14 1033  Weight: 75.252 kg (165 lb 14.4 oz)    General appearance: alert, cooperative, appears stated age and no distress Resp: clear to auscultation bilaterally Cardio: regular rate and rhythm, S1, S2 normal, no murmur, click, rub or gallop GI: soft, non-tender; bowel sounds normal; no masses,  no organomegaly Extremities: extremities normal, atraumatic, no cyanosis or edema Neurologic: Right facial droop is noted. No other focal neurological deficits appreciated.  Lab Results:  Basic Metabolic Panel:  Recent Labs Lab 11/07/14 1059 11/07/14 1100  NA 140 140  K 3.8 3.9  CL 104 101  CO2 27  --   GLUCOSE  141* 139*  BUN 11 14  CREATININE 0.84 0.80  CALCIUM 9.7  --    Liver Function Tests:  Recent Labs Lab 11/07/14 1059  AST 23  ALT 35  ALKPHOS 73  BILITOT 0.7  PROT 6.7  ALBUMIN 4.4   CBC:  Recent Labs Lab 11/07/14 1059 11/07/14 1100  WBC 4.7  --   NEUTROABS 3.3  --   HGB 16.5 16.7  HCT 48.7 49.0  MCV 93.3  --   PLT 141*  --       Studies/Results: Mr Brain Wo Contrast  11/07/2014   CLINICAL DATA:  69 year old male with high cholesterol presenting with slurred speech, facial droop and difficulty writing for 3-4 days. Initial encounter.  EXAM: MRI HEAD WITHOUT CONTRAST  TECHNIQUE: Multiplanar, multiecho pulse sequences of the brain and surrounding structures were obtained without intravenous contrast.  COMPARISON:  None.  FINDINGS: Acute nonhemorrhagic infarct mid left corona radiata extending to superior aspect of the left lenticular nucleus.  Scattered punctate and patchy white matter type changes most consistent with result small vessel disease.  Mild global atrophy. Ventricular prominence may be related to atrophy although cannot exclude a mild component of hydrocephalus. The aqueduct is patent.  No intracranial mass lesion noted on this unenhanced exam.  No intracranial hemorrhage.  Major intracranial vascular structures are patent. Suggestion of dilated right internal carotid artery  cavernous segment.  Cervical medullary junction, pituitary region, pineal region and orbital structures unremarkable.  Mild mucosal thickening inferior aspect maxillary sinuses. Minimal mucosal thickening ethmoid sinus air cells.  IMPRESSION: Acute nonhemorrhagic infarct mid left corona radiata extending to superior aspect of the left lenticular nucleus.  Scattered punctate and patchy white matter type changes most consistent with result small vessel disease.  Mild global atrophy. Ventricular prominence may be related to atrophy although cannot exclude a mild component of hydrocephalus. The aqueduct  is patent.  Major intracranial vascular structures are patent. Suggestion of dilated right internal carotid artery cavernous segment.  Mild mucosal thickening inferior aspect maxillary sinuses. Minimal mucosal thickening ethmoid sinus air cells.   Electronically Signed   By: Lacy Duverney M.D.   On: 11/07/2014 13:39   Mr Maxine Glenn Head/brain Wo Cm  11/07/2014   CLINICAL DATA:  69 year old male with acute left corona radiata/left lenticular nucleus infarct. Ectatic internal carotid artery. Subsequent encounter.  EXAM: MRA HEAD WITHOUT CONTRAST  TECHNIQUE: Angiographic images of the Circle of Willis were obtained using MRA technique without intravenous contrast.  COMPARISON:  11/07/2014 brain MR.  FINDINGS: Anterior circulation without medium or large size vessel significant stenosis or occlusion.  Ectatic internal carotid arteries most notable proximal cavernous segment, slightly more notable on right where the right internal carotid artery measures up to 6.8 mm. No focal saccular aneurysm.  Fetal type contribution to the right posterior cerebral artery.  Ectatic vertebral arteries with right side slightly dominant size. Mild narrowing of portions of the distal vertebral arteries.  Nonvisualized left posterior inferior cerebellar artery and both anterior inferior cerebellar arteries.  Mild narrowing and irregularity of the basilar artery.  Mild narrowing distal posterior cerebral artery branches more notable on the right.  IMPRESSION: Anterior circulation without medium or large size vessel significant stenosis or occlusion.  Ectatic internal carotid arteries most notable proximal cavernous segment, slightly more notable on right where the right internal carotid artery measures up to 6.8 mm. No focal saccular aneurysm.  Fetal type contribution to the right posterior cerebral artery.  Ectatic vertebral arteries. Mild narrowing of portions of the distal vertebral arteries.  Nonvisualized left posterior inferior cerebellar  artery and both anterior inferior cerebellar arteries.  Mild narrowing and irregularity of the basilar artery.  Mild narrowing distal posterior cerebral artery branches more notable on the right.   Electronically Signed   By: Lacy Duverney M.D.   On: 11/07/2014 19:43    Medications:  Scheduled: . [START ON 11/09/2014] clopidogrel  75 mg Oral Daily  . enoxaparin (LOVENOX) injection  40 mg Subcutaneous Q24H  . pneumococcal 23 valent vaccine  0.5 mL Intramuscular Tomorrow-1000  . pravastatin  40 mg Oral q1800   Continuous:  ZOX:[id number] **OR** acetaminophen  Assessment/Plan:  Principal Problem:   CVA (cerebral vascular accident) Active Problems:   Hypercholesteremia    Acute stroke Stroke workup is in progress. LDL is 173. HbA1c is pending. Echocardiogram, carotid Doppler pending. Patient has been seen by speech therapist. He has passed a swallow evaluation. Diet has been ordered. Neurology is following. He has been started on Plavix. PT and OT to evaluate.   Hyperglycemia This is a nonfasting level. Fasting level will be checked tomorrow morning. HbA1c is pending.   History of hypercholesterolemia Long-standing history of hypercholesterolemia. Previously he used to be on atorvastatin with which he developed side effects with pain in his fingers. Subsequently he was switched over to pravastatin. LDL is elevated at 173.   ADDENDUM At  around 11:30, patient had a fall when he was in the bathroom stumbling on object lying on the floor. According to the nursing staff, he hit his head on bedside commode. No loss of consciousness. Patient does not have any focal deficits. Denies any headaches. He'll be monitored closely. If he develops symptoms, imaging studies will be ordered. No other injuries noted.  DVT Prophylaxis: Lovenox    Code Status: Full code  Family Communication: Discussed with the patient and his wife  Disposition Plan: Await stroke workup to be completed.    LOS: 1  day   [facility]  [facility] Pager [phone] 11/08/2014, 10:51 AM  If 7PM-7AM, please contact night-coverage at [email or web address], password [email or web address]

## 2014-11-08 NOTE — Progress Notes (Signed)
STROKE TEAM PROGRESS NOTE   SUBJECTIVE (INTERVAL HISTORY) No family is at the bedside.  Overall he feels his condition is gradually improving. He stated that his speech and writing is better.    OBJECTIVE Temp:  [97.7 F (36.5 C)-98.8 F (37.1 C)] 97.7 F (36.5 C) (09/13 1116) Pulse Rate:  [50-71] 69 (09/13 1300) Cardiac Rhythm:  [-] Sinus bradycardia (09/13 0739) Resp:  [11-18] 18 (09/13 1230) BP: (110-163)/(67-87) 147/78 mmHg (09/13 1300) SpO2:  [96 %-99 %] 98 % (09/13 1300)  No results for input(s): GLUCAP in the last 168 hours.  Recent Labs Lab 11/07/14 1059 11/07/14 1100  NA 140 140  K 3.8 3.9  CL 104 101  CO2 27  --   GLUCOSE 141* 139*  BUN 11 14  CREATININE 0.84 0.80  CALCIUM 9.7  --     Recent Labs Lab 11/07/14 1059  AST 23  ALT 35  ALKPHOS 73  BILITOT 0.7  PROT 6.7  ALBUMIN 4.4    Recent Labs Lab 11/07/14 1059 11/07/14 1100  WBC 4.7  --   NEUTROABS 3.3  --   HGB 16.5 16.7  HCT 48.7 49.0  MCV 93.3  --   PLT 141*  --    No results for input(s): CKTOTAL, CKMB, CKMBINDEX, TROPONINI in the last 168 hours.  Recent Labs  11/07/14 1059  LABPROT 13.6  INR 1.02   No results for input(s): COLORURINE, LABSPEC, PHURINE, GLUCOSEU, HGBUR, BILIRUBINUR, KETONESUR, PROTEINUR, UROBILINOGEN, NITRITE, LEUKOCYTESUR in the last 72 hours.  Invalid input(s): APPERANCEUR     Component Value Date/Time   CHOL 292* 11/08/2014 0654   TRIG 298* 11/08/2014 0654   HDL 59 11/08/2014 0654   CHOLHDL 4.9 11/08/2014 0654   VLDL 60* 11/08/2014 0654   LDLCALC 173* 11/08/2014 0654   No results found for: HGBA1C No results found for: LABOPIA, COCAINSCRNUR, LABBENZ, AMPHETMU, THCU, LABBARB  No results for input(s): ETH in the last 168 hours.  I have personally reviewed the radiological images below and agree with the radiology interpretations.  Mri and Mra Head/brain Wo Cm  11/07/2014    IMPRESSION: Anterior circulation without medium or large size vessel significant  stenosis or occlusion.  Ectatic internal carotid arteries most notable proximal cavernous segment, slightly more notable on right where the right internal carotid artery measures up to 6.8 mm. No focal saccular aneurysm.  Fetal type contribution to the right posterior cerebral artery.  Ectatic vertebral arteries. Mild narrowing of portions of the distal vertebral arteries.  Nonvisualized left posterior inferior cerebellar artery and both anterior inferior cerebellar arteries.  Mild narrowing and irregularity of the basilar artery.  Mild narrowing distal posterior cerebral artery branches more notable on the right.   Carotid Doppler  pending  2D Echocardiogram  pending   PHYSICAL EXAM  Temp:  [97.7 F (36.5 C)-98.8 F (37.1 C)] 97.7 F (36.5 C) (09/13 1116) Pulse Rate:  [50-71] 69 (09/13 1300) Resp:  [11-18] 18 (09/13 1230) BP: (110-163)/(67-87) 147/78 mmHg (09/13 1300) SpO2:  [96 %-99 %] 98 % (09/13 1300)  General - Well nourished, well developed, in no apparent distress.  Ophthalmologic - Sharp disc margins OU.  Cardiovascular - Regular rate and rhythm with no murmur.  Mental Status -  Level of arousal and orientation to time, place, and person were intact. Language including expression, naming, repetition, comprehension was assessed and found intact. Fund of Knowledge was assessed and was intact.  Cranial Nerves II - XII - II - Visual field intact OU.  III, IV, VI - Extraocular movements intact. V - Facial sensation intact bilaterally. VII - right facial droop. VIII - Hearing & vestibular intact bilaterally. X - Palate elevates symmetrically. XI - Chin turning & shoulder shrug intact bilaterally. XII - Tongue protrusion intact.  Motor Strength - The patient's strength was normal in all extremities and pronator drift was absent.  Bulk was normal and fasciculations were absent.   Motor Tone - Muscle tone was assessed at the neck and appendages and was normal.  Reflexes - The  patient's reflexes were symmetrical in all extremities and he had no pathological reflexes.  Sensory - Light touch, temperature/pinprick were assessed and were symmetrical.    Coordination - The patient had normal movements in the hands and feet with no ataxia or dysmetria.  Tremor was absent.  Gait and Station - The patient's transfers, posture, gait, station, and turns were observed as normal.   ASSESSMENT/PLAN [name] is a 69 y.o. male with history of HLD admitted for right facial droop and slurry speech and decreased dexterity at right hand. Symptoms improving.    Stroke:  left BG / CR small infarct likely secondary to small vessel disease source  MRI  Left BG small infarct extending to CR  MRA  unremarkable  Carotid Doppler  pending  2D Echo  pending  LDL 173  HgbA1c pending  lovenox for VTE prophylaxis  Diet Heart Room service appropriate?: Yes; Fluid consistency:: Thin   no antithrombotic prior to admission, now on aspirin 325 mg orally every day. Recommend to change to plavix. Continue plavix at discharge.  Patient counseled to be compliant with his antithrombotic medications  Ongoing aggressive stroke risk factor management  Therapy recommendations:  pending  Disposition:  pending  Hypertension  Home meds:   none Permissive hypertension (OK if <220/120) for 24-48 hours post stroke and then gradually normalized within 5-7 days.  Stable  Hyperlipidemia  Home meds:  pravastatin 40  Currently on pravastatin  LDL 173, goal < 70  Continue statin at discharge  Pt would like to discuss with PCP about statin options as outpt  Other Stroke Risk Factors  Advanced age  Other Active Problems    Other Pertinent History    Hospital day # 1   Marvel Plan, MD PhD Stroke Neurology 11/08/2014 1:26 PM    To contact Stroke Continuity provider, please refer to [email or web address]. After hours, contact General Neurology

## 2014-11-08 NOTE — Progress Notes (Signed)
Utilization review completed. Sephira Zellman, RN, BSN. 

## 2014-11-08 NOTE — Evaluation (Signed)
Clinical/Bedside Swallow Evaluation Patient Details  Name: Thomas French MRN: [id number] Date of Birth: 07-04-1945  Today's Date: 11/08/2014 Time: SLP Start Time (ACUTE ONLY): 0955 SLP Stop Time (ACUTE ONLY): 1012 SLP Time Calculation (min) (ACUTE ONLY): 17 min  Past Medical History:  Past Medical History  Diagnosis Date  . High cholesterol    Past Surgical History: History reviewed. No pertinent past surgical history. HPI:  69 y.o. male admitted with speech and swallowing changes.  MRI revealed acute nonhemorrhagic infarct mid left corona radiata extending to superior aspect of the left lenticular nucleus.     Assessment / Plan / Recommendation Clinical Impression  Pt presents with normal oropharyngeal swallow with adequate mastication, brisk swallow response, and no s/s of aspiration.  Recommend resuming a regular diet with thin liquids; no SLP f/u warranted.      Aspiration Risk  Mild    Diet Recommendation Age appropriate regular solids;Thin   Medication Administration: Whole meds with liquid    Other  Recommendations Oral Care Recommendations: Oral care BID   Follow Up Recommendations    none    Swallow Study Prior Functional Status       General Date of Onset: 11/08/14 Other Pertinent Information: 69 y.o. male admitted with speech and swallowing changes.  MRI revealed acute nonhemorrhagic infarct mid left corona radiata extending to superior aspect of the left lenticular nucleus.   Type of Study: Bedside swallow evaluation Previous Swallow Assessment: no Diet Prior to this Study: NPO Temperature Spikes Noted: No Respiratory Status: Room air History of Recent Intubation: No Behavior/Cognition: Alert;Cooperative;Pleasant mood Oral Cavity - Dentition: Adequate natural dentition/normal for age Self-Feeding Abilities: Able to feed self Patient Positioning: Upright in bed Baseline Vocal Quality: Normal Volitional Cough: Strong Volitional Swallow: Able to elicit     Oral/Motor/Sensory Function Overall Oral Motor/Sensory Function:  (mild asymmetry CN VII right lower face)   Ice Chips Ice chips: Within functional limits   Thin Liquid Thin Liquid: Within functional limits Presentation: Cup;Self Fed    Nectar Thick Nectar Thick Liquid: Not tested   Honey Thick Honey Thick Liquid: Not tested   Puree Puree: Within functional limits   Solid  Thomas French L. [place], [place] CCC/SLP Pager [phone]     Solid: Within functional limits       Thomas French 11/08/2014,10:38 AM

## 2014-11-09 ENCOUNTER — Ambulatory Visit (HOSPITAL_COMMUNITY): Payer: Medicare Other

## 2014-11-09 DIAGNOSIS — I639 Cerebral infarction, unspecified: Secondary | ICD-10-CM

## 2014-11-09 DIAGNOSIS — I6789 Other cerebrovascular disease: Secondary | ICD-10-CM

## 2014-11-09 LAB — CBC
HEMATOCRIT: 48.3 % (ref 39.0–52.0)
HEMOGLOBIN: 16.2 g/dL (ref 13.0–17.0)
MCH: 31.2 pg (ref 26.0–34.0)
MCHC: 33.5 g/dL (ref 30.0–36.0)
MCV: 93.1 fL (ref 78.0–100.0)
Platelets: 145 10*3/uL — ABNORMAL LOW (ref 150–400)
RBC: 5.19 MIL/uL (ref 4.22–5.81)
RDW: 12.7 % (ref 11.5–15.5)
WBC: 5.7 10*3/uL (ref 4.0–10.5)

## 2014-11-09 LAB — BASIC METABOLIC PANEL
Anion gap: 8 (ref 5–15)
BUN: 13 mg/dL (ref 6–20)
CALCIUM: 8.9 mg/dL (ref 8.9–10.3)
CHLORIDE: 102 mmol/L (ref 101–111)
CO2: 26 mmol/L (ref 22–32)
CREATININE: 0.97 mg/dL (ref 0.61–1.24)
GFR calc Af Amer: >60 mL/min (ref >60)
GFR calc non Af Amer: >60 mL/min (ref >60)
GLUCOSE: 102 mg/dL — AB (ref 65–99)
Potassium: 3.8 mmol/L (ref 3.5–5.1)
Sodium: 136 mmol/L (ref 135–145)

## 2014-11-09 LAB — HEMOGLOBIN A1C
Hgb A1c MFr Bld: 5.6 % (ref 4.8–5.6)
Mean Plasma Glucose: 114 mg/dL

## 2014-11-09 MED ORDER — CLOPIDOGREL BISULFATE 75 MG PO TABS: 75.0000 mg | ORAL_TABLET | Freq: Every day | ORAL | Status: DC

## 2014-11-09 MED ORDER — SIMVASTATIN 40 MG PO TABS: 40.0000 mg | ORAL_TABLET | Freq: Every day | ORAL | Status: AC

## 2014-11-09 MED ORDER — EZETIMIBE 10 MG PO TABS: 10.0000 mg | ORAL_TABLET | Freq: Every day | ORAL | Status: AC

## 2014-11-09 NOTE — Progress Notes (Signed)
Patient discharged home IV removed patient alert and oriented no complaints of pain prescriptions provided discharge summary reviewed.

## 2014-11-09 NOTE — Discharge Summary (Signed)
Physician Discharge Summary  Thomas French FIE:[id number] DOB: [birth year] DOA: 11/07/2014  PCP: Lolita Patella, MD  Admit date: 11/07/2014 Discharge date: 11/09/2014  Time spent: 40 minutes  Recommendations for Outpatient Follow-up:  1. Follow-up with primary care physician 1 week. 2. Follow-up with Dr. Roda Shutters and to 3 weeks  Discharge Diagnoses:  Principal Problem:   CVA (cerebral vascular accident) Active Problems:   Hypercholesteremia   Discharge Condition: Stable  Diet recommendation: Heart healthy  Filed Weights   11/07/14 1033  Weight: 75.252 kg (165 lb 14.4 oz)    History of present illness:  Thomas French is a 69 y.o. male with a past medical history of hypercholesterolemia, who denies any other health problems and was in his usual state of health till sometime last week when he noticed that he was not able to speak as fluently as he was before. He had difficulty speaking certain words. He also noticed that his handwriting was not his usual. He was writing erratically. And then his wife also noticed that his face looked asymmetrical. Patient also noticed that he was having difficulty swallowing liquids, especially when he would try to swallow them quickly. He decided to wait out the symptoms and called his primary care physician today. He was asked to come in to the emergency department for further evaluation. He denies any headaches, chest pain, shortness of breath, nausea, vomiting. Denies any weakness in his arms or legs. He's never had similar symptoms before. He feels as if his speech has improved but not quite back to his baseline.  Hospital Course:   Acute stroke Patient presented to the hospital with inability to speak and difficulty finding words. MRI of the brain showed acute nonhemorrhagic stroke. Seen by neurology and recommended Plavix. 2-D echocardiogram and carotid duplex without source of infarct Aggressive workup is inconclusive, follow-up with stroke  M.D. as outpatient.  Hyperglycemia Had nonfasting blood glucose of 141, hemoglobin A1c is 5.6.  History of hypercholesterolemia Total cholesterol is 292, triglyceride 298, HDL 59 and LDL is 173 Patient reported that he was on Vytorin before and did very well, after insurance switch he couldn't afford it. Had problems with atorvastatin, with some tingling and joints pain. Asked for simvastatin and Zetia prescription, prescription written for him. (AST 23, ALT 35.  Fall Patient fell in the bathroom in the hospital, had some bruise in his head. His MRI of the brain done after the fall, did not show any evidence of fractures or bleeding.  Procedures:  Carotid duplex and 2-D echocardiogram showed no evidence of abnormality.  Consultations:  None  Discharge Exam: Filed Vitals:   11/09/14 1353  BP: 134/85  Pulse: 66  Temp: 97.9 F (36.6 C)  Resp: 20   General: Alert and awake, oriented x3, not in any acute distress. HEENT: anicteric sclera, pupils reactive to light and accommodation, EOMI CVS: S1-S2 clear, no murmur rubs or gallops Chest: clear to auscultation bilaterally, no wheezing, rales or rhonchi Abdomen: soft nontender, nondistended, normal bowel sounds, no organomegaly Extremities: no cyanosis, clubbing or edema noted bilaterally Neuro: Cranial nerves II-XII intact, no focal neurological deficits  Discharge Instructions   Discharge Instructions    Diet - low sodium heart healthy    Complete by:  As directed      Increase activity slowly    Complete by:  As directed           Current Discharge Medication List    START taking these medications   Details  clopidogrel (PLAVIX)  75 MG tablet Take 1 tablet (75 mg total) by mouth daily. Qty: 30 tablet, Refills: 0    ezetimibe (ZETIA) 10 MG tablet Take 1 tablet (10 mg total) by mouth daily. Qty: 30 tablet, Refills: 0    simvastatin (ZOCOR) 40 MG tablet Take 1 tablet (40 mg total) by mouth daily. Qty: 30 tablet,  Refills: 0      STOP taking these medications     pravastatin (PRAVACHOL) 40 MG tablet        Allergies  Allergen Reactions  . Lipitor [Atorvastatin]    Follow-up Information    Follow up with Lolita Patella, MD. Schedule an appointment as soon as possible for a visit in 1 week.   Specialty:  Family Medicine   Contact information:   [phone] [place] [address] [phone]       Follow up with Xu,Jindong, MD. Schedule an appointment as soon as possible for a visit in 2 weeks.   Specialty:  Neurology   Contact information:   [address] [place] [address] [phone]        The results of significant diagnostics from this hospitalization (including imaging, microbiology, ancillary and laboratory) are listed below for reference.    Significant Diagnostic Studies: Mr Thomas French Contrast  11/07/2014   CLINICAL DATA:  69 year old male with high cholesterol presenting with slurred speech, facial droop and difficulty writing for 3-4 days. Initial encounter.  EXAM: MRI HEAD WITHOUT CONTRAST  TECHNIQUE: Multiplanar, multiecho pulse sequences of the brain and surrounding structures were obtained without intravenous contrast.  COMPARISON:  None.  FINDINGS: Acute nonhemorrhagic infarct mid left corona radiata extending to superior aspect of the left lenticular nucleus.  Scattered punctate and patchy white matter type changes most consistent with result small vessel disease.  Mild global atrophy. Ventricular prominence may be related to atrophy although cannot exclude a mild component of hydrocephalus. The aqueduct is patent.  No intracranial mass lesion noted on this unenhanced exam.  No intracranial hemorrhage.  Major intracranial vascular structures are patent. Suggestion of dilated right internal carotid artery cavernous segment.  Cervical medullary junction, pituitary region, pineal region and orbital structures unremarkable.  Mild  mucosal thickening inferior aspect maxillary sinuses. Minimal mucosal thickening ethmoid sinus air cells.  IMPRESSION: Acute nonhemorrhagic infarct mid left corona radiata extending to superior aspect of the left lenticular nucleus.  Scattered punctate and patchy white matter type changes most consistent with result small vessel disease.  Mild global atrophy. Ventricular prominence may be related to atrophy although cannot exclude a mild component of hydrocephalus. The aqueduct is patent.  Major intracranial vascular structures are patent. Suggestion of dilated right internal carotid artery cavernous segment.  Mild mucosal thickening inferior aspect maxillary sinuses. Minimal mucosal thickening ethmoid sinus air cells.   Electronically Signed   By: Lacy Duverney M.D.   On: 11/07/2014 13:39   Mr Maxine Glenn Head/brain Wo Cm  11/07/2014   CLINICAL DATA:  69 year old male with acute left corona radiata/left lenticular nucleus infarct. Ectatic internal carotid artery. Subsequent encounter.  EXAM: MRA HEAD WITHOUT CONTRAST  TECHNIQUE: Angiographic images of the Circle of Willis were obtained using MRA technique without intravenous contrast.  COMPARISON:  11/07/2014 brain MR.  FINDINGS: Anterior circulation without medium or large size vessel significant stenosis or occlusion.  Ectatic internal carotid arteries most notable proximal cavernous segment, slightly more notable on right where the right internal carotid artery measures up to 6.8 mm. No focal saccular aneurysm.  Fetal type contribution to the right posterior cerebral artery.  Ectatic vertebral arteries with right side slightly dominant size. Mild narrowing of portions of the distal vertebral arteries.  Nonvisualized left posterior inferior cerebellar artery and both anterior inferior cerebellar arteries.  Mild narrowing and irregularity of the basilar artery.  Mild narrowing distal posterior cerebral artery branches more notable on the right.  IMPRESSION: Anterior  circulation without medium or large size vessel significant stenosis or occlusion.  Ectatic internal carotid arteries most notable proximal cavernous segment, slightly more notable on right where the right internal carotid artery measures up to 6.8 mm. No focal saccular aneurysm.  Fetal type contribution to the right posterior cerebral artery.  Ectatic vertebral arteries. Mild narrowing of portions of the distal vertebral arteries.  Nonvisualized left posterior inferior cerebellar artery and both anterior inferior cerebellar arteries.  Mild narrowing and irregularity of the basilar artery.  Mild narrowing distal posterior cerebral artery branches more notable on the right.   Electronically Signed   By: Lacy Duverney M.D.   On: 11/07/2014 19:43    Microbiology: No results found for this or any previous visit (from the past 240 hour(s)).   Labs: Basic Metabolic Panel:  Recent Labs Lab 11/07/14 1059 11/07/14 1100 11/09/14 0523  NA 140 140 136  K 3.8 3.9 3.8  CL 104 101 102  CO2 27  --  26  GLUCOSE 141* 139* 102*  BUN 11 14 13   CREATININE 0.84 0.80 0.97  CALCIUM 9.7  --  8.9   Liver Function Tests:  Recent Labs Lab 11/07/14 1059  AST 23  ALT 35  ALKPHOS 73  BILITOT 0.7  PROT 6.7  ALBUMIN 4.4   No results for input(s): LIPASE, AMYLASE in the last 168 hours. No results for input(s): AMMONIA in the last 168 hours. CBC:  Recent Labs Lab 11/07/14 1059 11/07/14 1100 11/09/14 0523  WBC 4.7  --  5.7  NEUTROABS 3.3  --   --   HGB 16.5 16.7 16.2  HCT 48.7 49.0 48.3  MCV 93.3  --  93.1  PLT 141*  --  145*   Cardiac Enzymes: No results for input(s): CKTOTAL, CKMB, CKMBINDEX, TROPONINI in the last 168 hours. BNP: BNP (last 3 results) No results for input(s): BNP in the last 8760 hours.  ProBNP (last 3 results) No results for input(s): PROBNP in the last 8760 hours.  CBG: No results for input(s): GLUCAP in the last 168 hours.     Signed:  Caldwell Kronenberger A  [facility] 11/09/2014, 3:17 PM

## 2014-11-09 NOTE — Progress Notes (Signed)
  Echocardiogram 2D Echocardiogram has been performed.  Thomas French 11/09/2014, 1:25 PM

## 2014-11-09 NOTE — Progress Notes (Signed)
Occupational Therapy Evaluation Patient Details Name: Thomas French MRN: [id number] DOB: 09-26-1945 Today's Date: 11/09/2014    History of Present Illness Thomas French is a 69 y.o. male with a past medical history of hypercholesterolemia, who denies any other health problems and was in his usual state of health till sometime last week when he noticed that he was not able to speak as fluently as he was before. MRI reveal smal L BG with extension into CR.   Clinical Impression   Pt admitted with the above diagnoses. PTA pt was independent with ADLs. Pt is currently at baseline with ADLs. Pt completed community distance functional mobility while tossing a small ball between hands and serial counting by 2s with no observable difficulty. Reviewed s/s of a stroke with pt. No further OT needs indicated at this time. OT signing off.       Follow Up Recommendations  No OT follow up    Equipment Recommendations  3 in 1 bedside comode    Recommendations for Other Services       Precautions / Restrictions Precautions Precautions: None Precaution Comments: pt did have a fall in bathroom however due to pt weight-bearing on rolling laundry cart Restrictions Weight Bearing Restrictions: No      Mobility Bed Mobility Overal bed mobility: Modified Independent             General bed mobility comments: HOB elevated  Transfers Overall transfer level: Modified independent Equipment used: None             General transfer comment: from EOB    Balance Overall balance assessment: Modified Independent                                          ADL Overall ADL's : At baseline                                       General ADL Comments: Pt completed community distance functional mobility while tossing a small ball between hands and serial counting by 2s with no observable difficulty. Reviewed s/s of a stroke with pt. Discussed 3n1 as shower seat for  LB bathing at home.      Vision Vision Assessment?: No apparent visual deficits   Perception     Praxis      Pertinent Vitals/Pain Pain Assessment: No/denies pain     Hand Dominance Right   Extremity/Trunk Assessment Upper Extremity Assessment Upper Extremity Assessment: Overall WFL for tasks assessed   Lower Extremity Assessment Lower Extremity Assessment: Defer to PT evaluation   Cervical / Trunk Assessment Cervical / Trunk Assessment: Normal   Communication Communication Communication: No difficulties   Cognition Arousal/Alertness: Awake/alert Behavior During Therapy: WFL for tasks assessed/performed Overall Cognitive Status: Within Functional Limits for tasks assessed                     General Comments       Exercises       Shoulder Instructions      Home Living Family/patient expects to be discharged to:: Private residence Living Arrangements: Spouse/significant other Available Help at Discharge: Family;Available 24 hours/day Type of Home: House Home Access: Stairs to enter [employer] of Steps: 3 Entrance Stairs-Rails: Right Home Layout: Two level Alternate Level Stairs-Number of Steps: 12  Alternate Level Stairs-Rails: Right Bathroom Shower/Tub: Walk-in shower         Home Equipment: None          Prior Functioning/Environment Level of Independence: Independent        Comments: retired, drives, works out at [employer]    OT Diagnosis:     OT Problem List:     OT Treatment/Interventions:      OT Goals(Current goals can be found in the care plan section) Acute Rehab OT Goals Patient Stated Goal: home  OT Frequency:     Barriers to D/C:            Co-evaluation              End of Session    Activity Tolerance: Patient tolerated treatment well Patient left: in bed;with call bell/phone within reach;with bed alarm set   Time: 0981-1914 OT Time Calculation (min): 25 min Charges:  OT General  Charges $OT Visit: 1 Procedure OT Evaluation $Initial OT Evaluation Tier I: 1 Procedure OT Treatments $Self Care/Home Management : 8-22 mins G-Codes:    Pilar Grammes 11/12/14, 9:58 AM

## 2014-11-09 NOTE — Progress Notes (Signed)
*  PRELIMINARY RESULTS* Vascular Ultrasound Carotid Duplex (Doppler) has been completed.   Findings suggest 1-39% internal carotid artery stenosis bilaterally. Vertebral arteries are patent with antegrade flow.  11/09/2014 1:39 PM Gertie Fey, RVT, RDCS, RDMS

## 2014-11-09 NOTE — Progress Notes (Signed)
Nutrition Education Note  RD consulted for nutrition education..   Lipid Panel     Component Value Date/Time   CHOL 292* 11/08/2014 0654   TRIG 298* 11/08/2014 0654   HDL 59 11/08/2014 0654   CHOLHDL 4.9 11/08/2014 0654   VLDL 60* 11/08/2014 0654   LDLCALC 173* 11/08/2014 0654    RD provided "Cardiac Nutrition Therapy" handout from the Academy of [employer]etics. Reviewed patient's dietary recall. Pt reports eating a variety of foods but, within the past year he has been eating a lot more fatty meats. Provided examples on ways to decrease saturated fat and cholesterol intake in diet and discussed healthier fat alternaties. Discouraged intake of processed foods and use of salt shaker. Encouraged fresh fruits and vegetables as well as whole grain sources of carbohydrates to maximize fiber intake. Teach back method used.   Expect fair compliance.  Body mass index is 27.61 kg/(m^2). Pt meets criteria for Overweight based on current BMI.  Current diet order is Heart Healthy, patient is consuming approximately 100% of meals at this time. Labs and medications reviewed. No further nutrition interventions warranted at this time. RD contact information provided. If additional nutrition issues arise, please re-consult RD.  Dorothea Ogle RD, LDN Inpatient Clinical Dietitian Pager: [phone] After Hours Pager: [phone]

## 2014-11-09 NOTE — Progress Notes (Signed)
STROKE TEAM PROGRESS NOTE   SUBJECTIVE (INTERVAL HISTORY) Wife is at the bedside.  Overall he feels his condition is gradually improving. He stated that his speech and writing is better. Had carotid Doppler and 2-D echo, which were unremarkable.   OBJECTIVE Temp:  [97.9 F (36.6 C)-98.6 F (37 C)] 97.9 F (36.6 C) (09/14 1353) Pulse Rate:  [52-70] 66 (09/14 1353) Cardiac Rhythm:  [-] Sinus bradycardia (09/14 0730) Resp:  [15-20] 20 (09/14 1353) BP: (122-149)/(65-88) 134/85 mmHg (09/14 1353) SpO2:  [96 %-100 %] 98 % (09/14 1353)  No results for input(s): GLUCAP in the last 168 hours.  Recent Labs Lab 11/07/14 1059 11/07/14 1100 11/09/14 0523  NA 140 140 136  K 3.8 3.9 3.8  CL 104 101 102  CO2 27  --  26  GLUCOSE 141* 139* 102*  BUN CREATININE 0.84 0.80 0.97  CALCIUM 9.7  --  8.9    Recent Labs Lab 11/07/14 1059  AST 23  ALT 35  ALKPHOS 73  BILITOT 0.7  PROT 6.7  ALBUMIN 4.4    Recent Labs Lab 11/07/14 1059 11/07/14 1100 11/09/14 0523  WBC 4.7  --  5.7  NEUTROABS 3.3  --   --   HGB 16.5 16.7 16.2  HCT 48.7 49.0 48.3  MCV 93.3  --  93.1  PLT 141*  --  145*   No results for input(s): CKTOTAL, CKMB, CKMBINDEX, TROPONINI in the last 168 hours.  Recent Labs  11/07/14 1059  LABPROT 13.6  INR 1.02   No results for input(s): COLORURINE, LABSPEC, PHURINE, GLUCOSEU, HGBUR, BILIRUBINUR, KETONESUR, PROTEINUR, UROBILINOGEN, NITRITE, LEUKOCYTESUR in the last 72 hours.  Invalid input(s): APPERANCEUR     Component Value Date/Time   CHOL 292* 11/08/2014 0654   TRIG 298* 11/08/2014 0654   HDL 59 11/08/2014 0654   CHOLHDL 4.9 11/08/2014 0654   VLDL 60* 11/08/2014 0654   LDLCALC 173* 11/08/2014 0654   Lab Results  Component Value Date   HGBA1C 5.6 11/08/2014   No results found for: LABOPIA, COCAINSCRNUR, LABBENZ, AMPHETMU, THCU, LABBARB  No results for input(s): ETH in the last 168 hours.  I have personally reviewed the radiological images below  and agree with the radiology interpretations.  Mri and Mra Head/brain Wo Cm  11/07/2014    IMPRESSION: Anterior circulation without medium or large size vessel significant stenosis or occlusion.  Ectatic internal carotid arteries most notable proximal cavernous segment, slightly more notable on right where the right internal carotid artery measures up to 6.8 mm. No focal saccular aneurysm.  Fetal type contribution to the right posterior cerebral artery.  Ectatic vertebral arteries. Mild narrowing of portions of the distal vertebral arteries.  Nonvisualized left posterior inferior cerebellar artery and both anterior inferior cerebellar arteries.  Mild narrowing and irregularity of the basilar artery.  Mild narrowing distal posterior cerebral artery branches more notable on the right.   Carotid Doppler  Bilateral: 1-39% ICA stenosis. Vertebral artery flow is antegrade.  2D Echocardiogram  - Left ventricle: The cavity size was normal. Wall thickness was increased in a pattern of mild LVH. Systolic function was normal. The estimated ejection fraction was in the range of 55% to 60%. Wall motion was normal; there were no regional wall motion abnormalities. - Atrial septum: No defect or patent foramen ovale was identified.  PHYSICAL EXAM  Temp:  [97.9 F (36.6 C)-98.6 F (37 C)] 97.9 F (36.6 C) (09/14 1353) Pulse Rate:  [52-70] 66 (09/14 1353) Resp:  [  15-20] 20 (09/14 1353) BP: (122-149)/(65-88) 134/85 mmHg (09/14 1353) SpO2:  [96 %-100 %] 98 % (09/14 1353)  General - Well nourished, well developed, in no apparent distress.  Ophthalmologic - Sharp disc margins OU.  Cardiovascular - Regular rate and rhythm with no murmur.  Mental Status -  Level of arousal and orientation to time, place, and person were intact. Language including expression, naming, repetition, comprehension was assessed and found intact. Fund of Knowledge was assessed and was intact.  Cranial Nerves II - XII  - II - Visual field intact OU. III, IV, VI - Extraocular movements intact. V - Facial sensation intact bilaterally. VII - right facial droop. VIII - Hearing & vestibular intact bilaterally. X - Palate elevates symmetrically. XI - Chin turning & shoulder shrug intact bilaterally. XII - Tongue protrusion intact.  Motor Strength - The patient's strength was normal in all extremities and pronator drift was absent.  Bulk was normal and fasciculations were absent.   Motor Tone - Muscle tone was assessed at the neck and appendages and was normal.  Reflexes - The patient's reflexes were symmetrical in all extremities and he had no pathological reflexes.  Sensory - Light touch, temperature/pinprick were assessed and were symmetrical.    Coordination - The patient had normal movements in the hands and feet with no ataxia or dysmetria.  Tremor was absent.  Gait and Station - The patient's transfers, posture, gait, station, and turns were observed as normal.   ASSESSMENT/PLAN [name] is a 69 y.o. male with history of HLD admitted for right facial droop and slurry speech and decreased dexterity at right hand. Symptoms improving.    Stroke:  left BG / CR small infarct likely secondary to small vessel disease source  MRI  Left BG small infarct extending to CR  MRA  unremarkable  Carotid Doppler  unremarkable  2D Echo  unremarkable  LDL 173  HgbA1c 5.6  lovenox for VTE prophylaxis Diet Heart Room service appropriate?: Yes; Fluid consistency:: Thin  Diet - low sodium heart healthy   no antithrombotic prior to admission, now on plavix. Continue plavix at discharge.  Patient counseled to be compliant with his antithrombotic medications  Ongoing aggressive stroke risk factor management  Therapy recommendations:  pending  Disposition:  pending  Hyperlipidemia  Home meds:  pravastatin 40  Currently on pravastatin  LDL 173, goal < 70  Continue statin at discharge  Pt  would like to discuss with PCP about statin options as outpt  Other Stroke Risk Factors  Advanced age  Other Active Problems    Other Pertinent History    Hospital day # 2  Neurology will sign off. Please call with questions. Pt will follow up with Dr. Roda Shutters at [facility] in about 2 months. Thanks for the consult.   Marvel Plan, MD PhD Stroke Neurology 11/09/2014 6:11 PM    To contact Stroke Continuity provider, please refer to [email or web address]. After hours, contact General Neurology

## 2014-11-15 DIAGNOSIS — I639 Cerebral infarction, unspecified: Secondary | ICD-10-CM | POA: Diagnosis not present

## 2014-11-15 DIAGNOSIS — E782 Mixed hyperlipidemia: Secondary | ICD-10-CM | POA: Diagnosis not present

## 2014-11-15 DIAGNOSIS — I10 Essential (primary) hypertension: Secondary | ICD-10-CM | POA: Diagnosis not present

## 2014-11-30 ENCOUNTER — Encounter: Payer: Self-pay | Admitting: Neurology

## 2014-11-30 ENCOUNTER — Ambulatory Visit (INDEPENDENT_AMBULATORY_CARE_PROVIDER_SITE_OTHER): Payer: Medicare Other | Admitting: Neurology

## 2014-11-30 VITALS — BP 119/72 | HR 61 | Ht 65.0 in | Wt 166.0 lb

## 2014-11-30 DIAGNOSIS — I63312 Cerebral infarction due to thrombosis of left middle cerebral artery: Secondary | ICD-10-CM

## 2014-11-30 DIAGNOSIS — I10 Essential (primary) hypertension: Secondary | ICD-10-CM

## 2014-11-30 DIAGNOSIS — I639 Cerebral infarction, unspecified: Secondary | ICD-10-CM | POA: Diagnosis not present

## 2014-11-30 DIAGNOSIS — E785 Hyperlipidemia, unspecified: Secondary | ICD-10-CM | POA: Insufficient documentation

## 2014-11-30 NOTE — Progress Notes (Signed)
STROKE NEUROLOGY FOLLOW UP NOTE  NAME: Thomas French DOB: [birth year]  REASON FOR VISIT: stroke follow up HISTORY FROM: patient and chart  Today we had the pleasure of seeing Thomas French in follow-up at our Neurology Clinic. Pt was accompanied by wife.   History Summary [name] is a 69 y.o. male with history of HLD admitted for right facial droop and slurry speech and decreased dexterity at right hand. 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 MRA, carotid Doppler, 2-D Echo unremarkable. LDL 173 and A1c 5.6. He was put on Plavix for stroke prevention. Patient discharged with PCP follow-up for LDL management.  Interval History During the interval time, the patient has been doing well.  He follow-up with PCP, and was put on Zetia and Zocor for hyperlipidemia and high TG. He still has mild right facial droop, other than that, no significant residual deficit. His blood pressure 140s/80s, and PCP Dr. Evlyn Kanner put him on losartan 100 mg. His blood pressure today in clinic 119/73.  REVIEW OF SYSTEMS: Full 14 system review of systems performed and notable only for those listed below and in HPI above, all others are negative:  Constitutional:   Cardiovascular:  Ear/Nose/Throat:   Skin:  Eyes:   Respiratory:   Gastroitestinal:   Genitourinary:  Hematology/Lymphatic:   Endocrine:  Musculoskeletal:   Allergy/Immunology:   Neurological:   Psychiatric:  Sleep:   The following represents the patient's updated allergies and side effects list: Allergies  Allergen Reactions  . Lipitor [Atorvastatin]     The neurologically relevant items on the patient's problem list were reviewed on today's visit.  Neurologic Examination  A problem focused neurological exam (12 or more points of the single system neurologic examination, vital signs counts as 1 point, cranial nerves count for 8 points) was performed.  Blood pressure 119/72,  pulse 61, height  (1.651 m), weight 166 lb (75.297 kg).  General - Well nourished, well developed, in no apparent distress.  Ophthalmologic - Sharp disc margins OU.  Cardiovascular - Regular rate and rhythm with no murmur.  Mental Status -  Level of arousal and orientation to time, place, and person were intact. Language including expression, naming, repetition, comprehension was assessed and found intact. Fund of Knowledge was assessed and was intact.  Cranial Nerves II - XII - II - Visual field intact OU. III, IV, VI - Extraocular movements intact. V - Facial sensation intact bilaterally. VII - right facial droop. VIII - Hearing & vestibular intact bilaterally. X - Palate elevates symmetrically. XI - Chin turning & shoulder shrug intact bilaterally. XII - Tongue protrusion intact.  Motor Strength - The patient's strength was normal in all extremities and pronator drift was absent. Bulk was normal and fasciculations were absent.  Motor Tone - Muscle tone was assessed at the neck and appendages and was normal.  Reflexes - The patient's reflexes were symmetrical in all extremities and he had no pathological reflexes.  Sensory - Light touch, temperature/pinprick were assessed and were symmetrical.   Coordination - The patient had normal movements in the hands and feet with no ataxia or dysmetria. Tremor was absent.  Gait and Station - The patient's transfers, posture, gait, station, and turns were observed as normal.  Data reviewed: I personally reviewed the images and agree with the radiology interpretations.  Mri and Mra Head/brain Wo Cm  11/07/2014 IMPRESSION: Anterior circulation without medium or large size vessel significant stenosis or occlusion. Ectatic internal  carotid arteries most notable proximal cavernous segment, slightly more notable on right where the right internal carotid artery measures up to 6.8 mm. No focal saccular aneurysm. Fetal type  contribution to the right posterior cerebral artery. Ectatic vertebral arteries. Mild narrowing of portions of the distal vertebral arteries. Nonvisualized left posterior inferior cerebellar artery and both anterior inferior cerebellar arteries. Mild narrowing and irregularity of the basilar artery. Mild narrowing distal posterior cerebral artery branches more notable on the right.   Carotid Doppler Bilateral: 1-39% ICA stenosis. Vertebral artery flow is antegrade.  2D Echocardiogram - Left ventricle: The cavity size was normal. Wall thickness was increased in a pattern of mild LVH. Systolic function was normal. The estimated ejection fraction was in the range of 55% to 60%. Wall motion was normal; there were no regional wall motion abnormalities. - Atrial septum: No defect or patent foramen ovale was identified.  Component     Latest Ref Rng 11/08/2014  Cholesterol     0 - 200 mg/dL 161 (H)  Triglycerides     <150 mg/dL 096 (H)  HDL Cholesterol     >40 mg/dL 59  Total CHOL/HDL Ratio      4.9  VLDL     0 - 40 mg/dL 60 (H)  LDL (calc)     0 - 99 mg/dL 045 (H)  Hemoglobin W0J     4.8 - 5.6 % 5.6  Mean Plasma Glucose      114    Assessment: As you may recall, he is a 69 y.o. Asian male with PMH of HLD  admitted for left BG/CR small infarct likely secondary to small vessel disease. MRA, carotid Doppler, 2-D Echo unremarkable. LDL 173 and A1c 5.6. He was put on Plavix for stroke prevention. Follow-up with PCP put on that he and Zocor for hyperlipidemia and stroke prevention. Also put on losartan for BP control. Still has right mild facial droop.  Plan:  - continue plavix and zocor for stroke prevention - continue zetia for elevated TG - Follow up with your primary care physician for stroke risk factor modification. Recommend maintain blood pressure goal around 130/80, diabetes with hemoglobin A1c goal below 6.5% and lipids with LDL cholesterol goal below 70 mg/dL.  -  check BP at home, BP goal 120-140 - RTC in 6 months  No orders of the defined types were placed in this encounter.    Meds ordered this encounter  Medications  . LOSARTAN POTASSIUM PO    Sig: Take 100 mg by mouth.    Patient Instructions  - continue plavix and zocor for stroke prevention - continue zetia for TG management - Follow up with your primary care physician for stroke risk factor modification. Recommend maintain blood pressure goal around 130/80, diabetes with hemoglobin A1c goal below 6.5% and lipids with LDL cholesterol goal below 70 mg/dL.  - check BP at home, BP goal 120-140 - follow up in 6 months   Marvel Plan, MD PhD [facility] [address], [address] [place], [place] [address] [phone]

## 2014-11-30 NOTE — Patient Instructions (Signed)
-   continue plavix and zocor for stroke prevention - continue zetia for TG management - Follow up with your primary care physician for stroke risk factor modification. Recommend maintain blood pressure goal around 130/80, diabetes with hemoglobin A1c goal below 6.5% and lipids with LDL cholesterol goal below 70 mg/dL.  - check BP at home, BP goal 120-140 - follow up in 6 months

## 2014-12-07 ENCOUNTER — Other Ambulatory Visit: Payer: Self-pay | Admitting: Neurology

## 2014-12-07 DIAGNOSIS — I63312 Cerebral infarction due to thrombosis of left middle cerebral artery: Secondary | ICD-10-CM

## 2014-12-07 MED ORDER — CLOPIDOGREL BISULFATE 75 MG PO TABS: 75.0000 mg | ORAL_TABLET | Freq: Every day | ORAL | Status: DC

## 2014-12-13 DIAGNOSIS — I10 Essential (primary) hypertension: Secondary | ICD-10-CM | POA: Diagnosis not present

## 2014-12-13 DIAGNOSIS — Z23 Encounter for immunization: Secondary | ICD-10-CM | POA: Diagnosis not present

## 2015-02-06 DIAGNOSIS — E782 Mixed hyperlipidemia: Secondary | ICD-10-CM | POA: Diagnosis not present

## 2015-04-19 DIAGNOSIS — G629 Polyneuropathy, unspecified: Secondary | ICD-10-CM | POA: Diagnosis not present

## 2015-04-19 DIAGNOSIS — R200 Anesthesia of skin: Secondary | ICD-10-CM | POA: Diagnosis not present

## 2015-05-31 ENCOUNTER — Encounter: Payer: Self-pay | Admitting: Neurology

## 2015-05-31 ENCOUNTER — Ambulatory Visit (INDEPENDENT_AMBULATORY_CARE_PROVIDER_SITE_OTHER): Payer: Medicare Other | Admitting: Neurology

## 2015-05-31 VITALS — BP 116/72 | HR 62 | Ht 65.0 in | Wt 167.0 lb

## 2015-05-31 DIAGNOSIS — I10 Essential (primary) hypertension: Secondary | ICD-10-CM | POA: Diagnosis not present

## 2015-05-31 DIAGNOSIS — E785 Hyperlipidemia, unspecified: Secondary | ICD-10-CM | POA: Diagnosis not present

## 2015-05-31 DIAGNOSIS — I63312 Cerebral infarction due to thrombosis of left middle cerebral artery: Secondary | ICD-10-CM

## 2015-05-31 NOTE — Progress Notes (Signed)
STROKE NEUROLOGY FOLLOW UP NOTE  NAME: Thomas French DOB: [birth year]  REASON FOR VISIT: stroke follow up HISTORY FROM: French and chart  Today we had Thomas pleasure of seeing Thomas French in follow-up at our Neurology Clinic. Pt was accompanied by wife.   History Summary Thomas French is a 70 y.o. male with history of HLD admitted for right facial droop and slurry speech and decreased dexterity at right hand. 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 MRA, carotid Doppler, 2-D Echo unremarkable. LDL 173 and A1c 5.6. Thomas French was put on Plavix for stroke prevention. French discharged with PCP follow-up for LDL management.  11/30/14 follow up - Thomas French has been doing well.  Thomas French follow-up with PCP, and was put on Zetia and Zocor for hyperlipidemia and high TG. Thomas French still has mild right facial droop, other than that, no significant residual deficit. Thomas French blood pressure 140s/80s, and PCP Dr. Evlyn Kanner put him on losartan 100 mg. Thomas French blood pressure today in clinic 119/73.  Interval History During Thomas interval time, Thomas pt has been doing well. No recurrent stroke like symptoms. Her BP today 116/72, and Thomas French losartan now is . Thomas French BP at home between 100-120. Thomas French stated that Thomas French felt fatigue in Thomas afternoon and needs a nap these days. Other than that, Thomas French has no complains.   REVIEW OF SYSTEMS: Full 14 system review of systems performed and notable only for those listed below and in HPI above, all others are negative:  Constitutional:  fatigue Cardiovascular:  Ear/Nose/Throat:   Skin:  Eyes:   Respiratory:   Gastroitestinal:   Genitourinary: urgency Hematology/Lymphatic:   Endocrine:  Musculoskeletal:   Allergy/Immunology:   Neurological:   Psychiatric:  Sleep:   Thomas following represents Thomas French's updated allergies and side effects list: Allergies  Allergen Reactions  . Lipitor [Atorvastatin]     Thomas neurologically relevant items on  Thomas French's problem list were reviewed on today's visit.  Neurologic Examination  A problem focused neurological exam (12 or more points of Thomas single system neurologic examination, vital signs counts as 1 point, cranial nerves count for 8 points) was performed.  Blood pressure 116/72, pulse 62, height  (1.651 m), weight 167 lb (75.751 kg).  General - Well nourished, well developed, in no apparent distress.  Ophthalmologic - Sharp disc margins OU.  Cardiovascular - Regular rate and rhythm with no murmur.  Mental Status -  Level of arousal and orientation to time, place, and person were intact. Language including expression, naming, repetition, comprehension was assessed and found intact. Fund of Knowledge was assessed and was intact.  Cranial Nerves II - XII - II - Visual field intact OU. III, IV, VI - Extraocular movements intact. V - Facial sensation intact bilaterally. VII - right facial droop. VIII - Hearing & vestibular intact bilaterally. X - Palate elevates symmetrically. XI - Chin turning & shoulder shrug intact bilaterally. XII - Tongue protrusion intact.  Motor Strength - Thomas French's strength was normal in all extremities and pronator drift was absent. Bulk was normal and fasciculations were absent.  Motor Tone - Muscle tone was assessed at Thomas neck and appendages and was normal.  Reflexes - Thomas French's reflexes were symmetrical in all extremities and Thomas French had no pathological reflexes.  Sensory - Light touch, temperature/pinprick were assessed and were symmetrical.   Coordination - Thomas French had normal movements in Thomas hands and feet with no ataxia or dysmetria. Tremor was absent.  Gait and Station - Thomas French's transfers, posture, gait, station, and turns were observed as normal.  Data reviewed: I personally reviewed Thomas images and agree with Thomas radiology interpretations.  Mri and Mra Head/brain Wo Cm  11/07/2014 IMPRESSION: Anterior  circulation without medium or large size vessel significant stenosis or occlusion. Ectatic internal carotid arteries most notable proximal cavernous segment, slightly more notable on right where Thomas right internal carotid artery measures up to 6.8 mm. No focal saccular aneurysm. Fetal type contribution to Thomas right posterior cerebral artery. Ectatic vertebral arteries. Mild narrowing of portions of Thomas distal vertebral arteries. Nonvisualized left posterior inferior cerebellar artery and both anterior inferior cerebellar arteries. Mild narrowing and irregularity of Thomas basilar artery. Mild narrowing distal posterior cerebral artery branches more notable on Thomas right.   Carotid Doppler Bilateral: 1-39% ICA stenosis. Vertebral artery flow is antegrade.  2D Echocardiogram - Left ventricle: Thomas cavity size was normal. Wall thickness was increased in a pattern of mild LVH. Systolic function was normal. Thomas estimated ejection fraction was in Thomas range of 55% to 60%. Wall motion was normal; there were no regional wall motion abnormalities. - Atrial septum: No defect or patent foramen ovale was identified.  Component     Latest Ref Rng 11/08/2014  Cholesterol     0 - 200 mg/dL 478292 (H)  Triglycerides     <150 mg/dL 295298 (H)  HDL Cholesterol     >40 mg/dL 59  Total CHOL/HDL Ratio      4.9  VLDL     0 - 40 mg/dL 60 (H)  LDL (calc)     0 - 99 mg/dL 621173 (H)  Hemoglobin H0QA1C     4.8 - 5.6 % 5.6  Mean Plasma Glucose      114    Assessment: As you may recall, Thomas French is a 70 y.o. Asian male with PMH of HLD  admitted for left BG/CR small infarct likely secondary to small vessel disease. MRA, carotid Doppler, 2-D Echo unremarkable. LDL 173 and A1c 5.6. Thomas French was put on Plavix for stroke prevention. Follow-up with PCP put on zetia and Zocor for hyperlipidemia and stroke prevention. Also put on losartan for BP control, currently at dose 50mg . Still has right slight facial droop.  Plan:  - continue  plavix and zocor for stroke prevention - continue zetia for elevated TG - Follow up with your primary care physician for stroke risk factor modification. Recommend maintain blood pressure goal < 130/80, diabetes with hemoglobin A1c goal below 6.5% and lipids with LDL cholesterol goal below 70 mg/dL.  - check BP at home and record and bring over to Dr. Nicholos Johnseade for medication adjustment.  - follow up as needed.  No orders of Thomas defined types were placed in this encounter.    Meds ordered this encounter  Medications  . losartan (COZAAR) 100 MG tablet    Sig: 50 mg.     French Instructions  - continue plavix and zocor for stroke prevention - continue zetia for elevated TG - Follow up with your primary care physician for stroke risk factor modification. Recommend maintain blood pressure goal around 130/80, diabetes with hemoglobin A1c goal below 6.5% and lipids with LDL cholesterol goal below 70 mg/dL.  - check BP at home and record and bring over to Dr. Nicholos Johnseade for medication adjustment.  - follow up as needed.    Marvel PlanJindong Glanda Spanbauer, MD PhD [facility] [address], [address] [place], [place] [address] [phone]

## 2015-05-31 NOTE — Patient Instructions (Signed)
-   continue plavix and zocor for stroke prevention - continue zetia for elevated TG - Follow up with your primary care physician for stroke risk factor modification. Recommend maintain blood pressure goal around 130/80, diabetes with hemoglobin A1c goal below 6.5% and lipids with LDL cholesterol goal below 70 mg/dL.  - check BP at home and record and bring over to Dr. Nicholos Johnseade for medication adjustment.  - follow up as needed.

## 2015-07-27 NOTE — Progress Notes (Signed)
Sent clearance fax back to [facility] for [facility]. Form fax twice and receive.

## 2015-08-14 DIAGNOSIS — Z86010 Personal history of colonic polyps: Secondary | ICD-10-CM | POA: Diagnosis not present

## 2015-10-09 DIAGNOSIS — I639 Cerebral infarction, unspecified: Secondary | ICD-10-CM | POA: Diagnosis not present

## 2015-10-09 DIAGNOSIS — Z0000 Encounter for general adult medical examination without abnormal findings: Secondary | ICD-10-CM | POA: Diagnosis not present

## 2015-10-09 DIAGNOSIS — D696 Thrombocytopenia, unspecified: Secondary | ICD-10-CM | POA: Diagnosis not present

## 2015-10-09 DIAGNOSIS — E782 Mixed hyperlipidemia: Secondary | ICD-10-CM | POA: Diagnosis not present

## 2015-10-09 DIAGNOSIS — Z23 Encounter for immunization: Secondary | ICD-10-CM | POA: Diagnosis not present

## 2015-10-09 DIAGNOSIS — K219 Gastro-esophageal reflux disease without esophagitis: Secondary | ICD-10-CM | POA: Diagnosis not present

## 2015-10-09 DIAGNOSIS — G629 Polyneuropathy, unspecified: Secondary | ICD-10-CM | POA: Diagnosis not present

## 2015-10-09 DIAGNOSIS — I10 Essential (primary) hypertension: Secondary | ICD-10-CM | POA: Diagnosis not present

## 2015-10-09 DIAGNOSIS — Z125 Encounter for screening for malignant neoplasm of prostate: Secondary | ICD-10-CM | POA: Diagnosis not present

## 2015-10-09 DIAGNOSIS — Z86010 Personal history of colonic polyps: Secondary | ICD-10-CM | POA: Diagnosis not present

## 2015-10-18 DIAGNOSIS — Z23 Encounter for immunization: Secondary | ICD-10-CM | POA: Diagnosis not present

## 2015-11-01 DIAGNOSIS — R740 Nonspecific elevation of levels of transaminase and lactic acid dehydrogenase [LDH]: Secondary | ICD-10-CM | POA: Diagnosis not present

## 2015-12-04 ENCOUNTER — Other Ambulatory Visit: Payer: Self-pay | Admitting: Neurology

## 2015-12-04 DIAGNOSIS — I63312 Cerebral infarction due to thrombosis of left middle cerebral artery: Secondary | ICD-10-CM

## 2015-12-05 ENCOUNTER — Other Ambulatory Visit: Payer: Self-pay | Admitting: Neurology

## 2015-12-05 DIAGNOSIS — I63312 Cerebral infarction due to thrombosis of left middle cerebral artery: Secondary | ICD-10-CM

## 2015-12-05 MED ORDER — CLOPIDOGREL BISULFATE 75 MG PO TABS: 75.0000 mg | ORAL_TABLET | Freq: Every day | ORAL | 3 refills | Status: AC

## 2015-12-20 DIAGNOSIS — R740 Nonspecific elevation of levels of transaminase and lactic acid dehydrogenase [LDH]: Secondary | ICD-10-CM | POA: Diagnosis not present

## 2016-02-21 DIAGNOSIS — I10 Essential (primary) hypertension: Secondary | ICD-10-CM | POA: Diagnosis not present

## 2016-04-10 DIAGNOSIS — E782 Mixed hyperlipidemia: Secondary | ICD-10-CM | POA: Diagnosis not present

## 2016-04-10 DIAGNOSIS — I639 Cerebral infarction, unspecified: Secondary | ICD-10-CM | POA: Diagnosis not present

## 2016-04-10 DIAGNOSIS — D696 Thrombocytopenia, unspecified: Secondary | ICD-10-CM | POA: Diagnosis not present

## 2016-04-10 DIAGNOSIS — I10 Essential (primary) hypertension: Secondary | ICD-10-CM | POA: Diagnosis not present

## 2016-05-16 DIAGNOSIS — R945 Abnormal results of liver function studies: Secondary | ICD-10-CM | POA: Diagnosis not present

## 2016-05-22 ENCOUNTER — Other Ambulatory Visit: Payer: Self-pay | Admitting: Physician Assistant

## 2016-05-22 DIAGNOSIS — R748 Abnormal levels of other serum enzymes: Secondary | ICD-10-CM

## 2016-06-10 ENCOUNTER — Ambulatory Visit
Admission: RE | Admit: 2016-06-10 | Discharge: 2016-06-10 | Disposition: A | Discharge status: Home / Self Care | Payer: Medicare Other | Source: Ambulatory Visit | Attending: Physician Assistant | Admitting: Physician Assistant

## 2016-06-10 DIAGNOSIS — R748 Abnormal levels of other serum enzymes: Secondary | ICD-10-CM

## 2016-06-10 DIAGNOSIS — K760 Fatty (change of) liver, not elsewhere classified: Secondary | ICD-10-CM | POA: Diagnosis not present

## 2016-11-07 DIAGNOSIS — I10 Essential (primary) hypertension: Secondary | ICD-10-CM | POA: Diagnosis not present

## 2016-11-07 DIAGNOSIS — Z23 Encounter for immunization: Secondary | ICD-10-CM | POA: Diagnosis not present

## 2016-11-07 DIAGNOSIS — G629 Polyneuropathy, unspecified: Secondary | ICD-10-CM | POA: Diagnosis not present

## 2016-11-07 DIAGNOSIS — Z0000 Encounter for general adult medical examination without abnormal findings: Secondary | ICD-10-CM | POA: Diagnosis not present

## 2016-11-07 DIAGNOSIS — Z8673 Personal history of transient ischemic attack (TIA), and cerebral infarction without residual deficits: Secondary | ICD-10-CM | POA: Diagnosis not present

## 2016-11-07 DIAGNOSIS — D696 Thrombocytopenia, unspecified: Secondary | ICD-10-CM | POA: Diagnosis not present

## 2016-11-07 DIAGNOSIS — Z6827 Body mass index (BMI) 27.0-27.9, adult: Secondary | ICD-10-CM | POA: Diagnosis not present

## 2016-11-07 DIAGNOSIS — E782 Mixed hyperlipidemia: Secondary | ICD-10-CM | POA: Diagnosis not present

## 2016-11-07 DIAGNOSIS — Z125 Encounter for screening for malignant neoplasm of prostate: Secondary | ICD-10-CM | POA: Diagnosis not present

## 2016-11-07 DIAGNOSIS — K219 Gastro-esophageal reflux disease without esophagitis: Secondary | ICD-10-CM | POA: Diagnosis not present

## 2016-11-12 DIAGNOSIS — H02831 Dermatochalasis of right upper eyelid: Secondary | ICD-10-CM | POA: Diagnosis not present

## 2016-11-12 DIAGNOSIS — H11153 Pinguecula, bilateral: Secondary | ICD-10-CM | POA: Diagnosis not present

## 2016-11-12 DIAGNOSIS — H02834 Dermatochalasis of left upper eyelid: Secondary | ICD-10-CM | POA: Diagnosis not present

## 2016-11-12 DIAGNOSIS — H2513 Age-related nuclear cataract, bilateral: Secondary | ICD-10-CM | POA: Diagnosis not present

## 2016-11-12 DIAGNOSIS — H43813 Vitreous degeneration, bilateral: Secondary | ICD-10-CM | POA: Diagnosis not present

## 2016-11-25 ENCOUNTER — Other Ambulatory Visit: Payer: Self-pay | Admitting: Neurology

## 2016-11-25 DIAGNOSIS — I63312 Cerebral infarction due to thrombosis of left middle cerebral artery: Secondary | ICD-10-CM

## 2017-02-07 DIAGNOSIS — E782 Mixed hyperlipidemia: Secondary | ICD-10-CM | POA: Diagnosis not present

## 2017-02-07 DIAGNOSIS — R945 Abnormal results of liver function studies: Secondary | ICD-10-CM | POA: Diagnosis not present

## 2017-11-13 DIAGNOSIS — D696 Thrombocytopenia, unspecified: Secondary | ICD-10-CM | POA: Diagnosis not present

## 2017-11-13 DIAGNOSIS — E782 Mixed hyperlipidemia: Secondary | ICD-10-CM | POA: Diagnosis not present

## 2017-11-13 DIAGNOSIS — Z1159 Encounter for screening for other viral diseases: Secondary | ICD-10-CM | POA: Diagnosis not present

## 2017-11-13 DIAGNOSIS — Z0000 Encounter for general adult medical examination without abnormal findings: Secondary | ICD-10-CM | POA: Diagnosis not present

## 2017-11-13 DIAGNOSIS — I10 Essential (primary) hypertension: Secondary | ICD-10-CM | POA: Diagnosis not present

## 2017-11-13 DIAGNOSIS — Z8673 Personal history of transient ischemic attack (TIA), and cerebral infarction without residual deficits: Secondary | ICD-10-CM | POA: Diagnosis not present

## 2017-11-13 DIAGNOSIS — Z1389 Encounter for screening for other disorder: Secondary | ICD-10-CM | POA: Diagnosis not present

## 2017-11-13 DIAGNOSIS — Z23 Encounter for immunization: Secondary | ICD-10-CM | POA: Diagnosis not present

## 2017-11-13 DIAGNOSIS — G629 Polyneuropathy, unspecified: Secondary | ICD-10-CM | POA: Diagnosis not present

## 2017-11-14 DIAGNOSIS — Z125 Encounter for screening for malignant neoplasm of prostate: Secondary | ICD-10-CM | POA: Diagnosis not present

## 2017-11-14 DIAGNOSIS — I10 Essential (primary) hypertension: Secondary | ICD-10-CM | POA: Diagnosis not present

## 2017-11-14 DIAGNOSIS — E782 Mixed hyperlipidemia: Secondary | ICD-10-CM | POA: Diagnosis not present

## 2017-11-14 DIAGNOSIS — Z0000 Encounter for general adult medical examination without abnormal findings: Secondary | ICD-10-CM | POA: Diagnosis not present

## 2018-01-12 DIAGNOSIS — H43813 Vitreous degeneration, bilateral: Secondary | ICD-10-CM | POA: Diagnosis not present

## 2018-01-12 DIAGNOSIS — H02831 Dermatochalasis of right upper eyelid: Secondary | ICD-10-CM | POA: Diagnosis not present

## 2018-01-12 DIAGNOSIS — H2513 Age-related nuclear cataract, bilateral: Secondary | ICD-10-CM | POA: Diagnosis not present

## 2018-01-12 DIAGNOSIS — H11153 Pinguecula, bilateral: Secondary | ICD-10-CM | POA: Diagnosis not present

## 2018-01-12 DIAGNOSIS — H02834 Dermatochalasis of left upper eyelid: Secondary | ICD-10-CM | POA: Diagnosis not present

## 2018-04-03 IMAGING — US US ABDOMEN LIMITED
1 series · 14 of 25 positions shown · non-contrast
Comparison: None.

CLINICAL DATA: Elevated liver enzymes

EXAM:
US ABDOMEN LIMITED - RIGHT UPPER QUADRANT

[Series 1: us abdomen limited · 0.21mm/px · 14 of 29 slices shown]
[im 1/29]
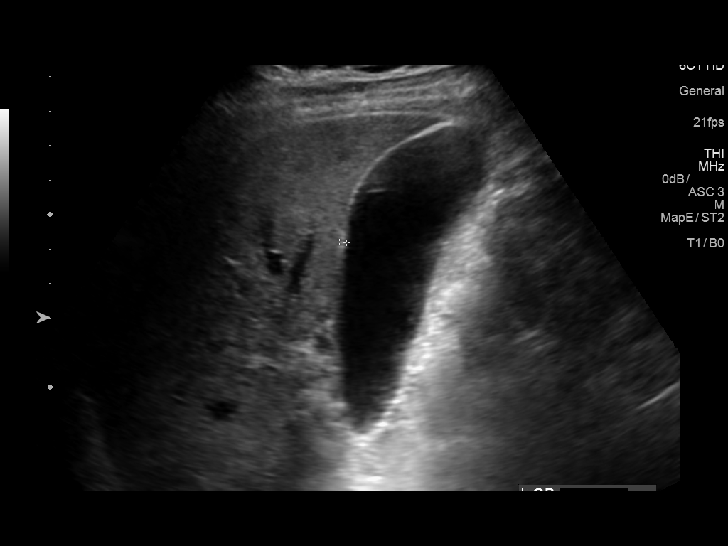
[im 3/29]
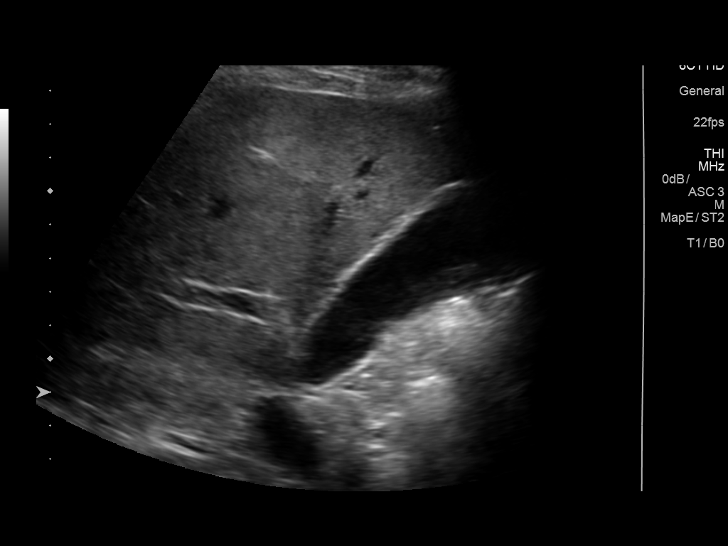
[im 5/29]
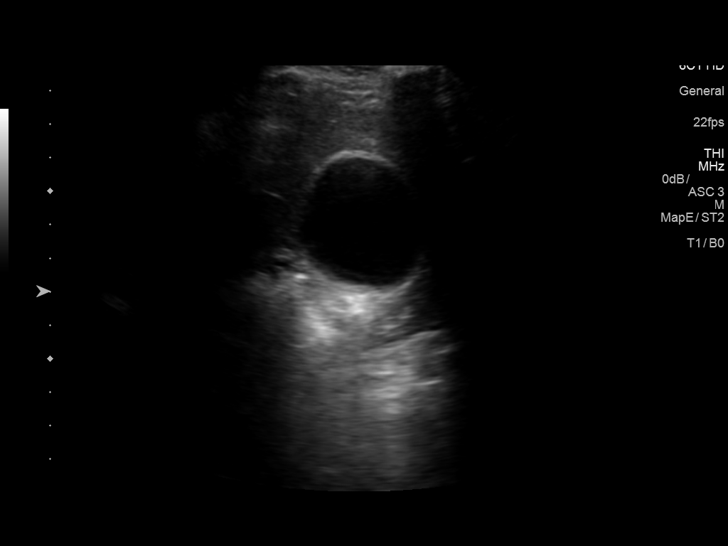
[im 8/29]
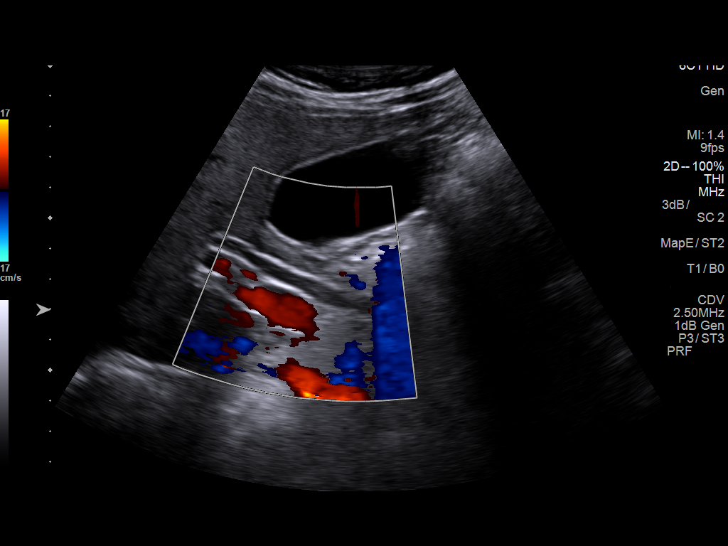
[im 10/29]
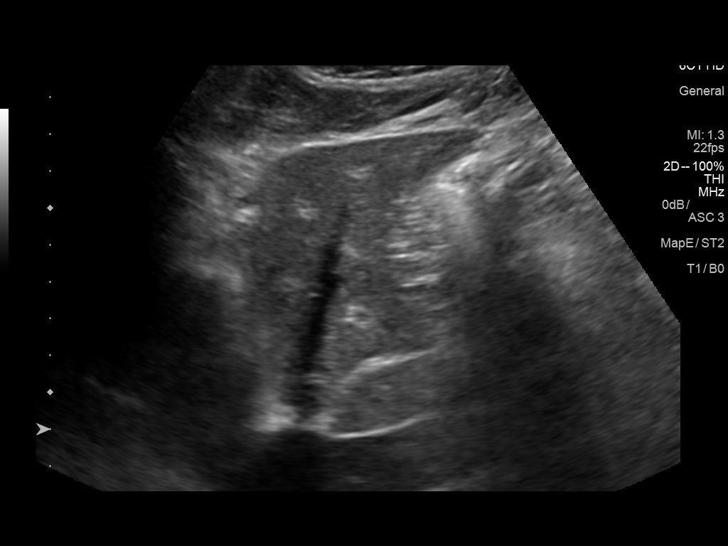
[im 11/29]
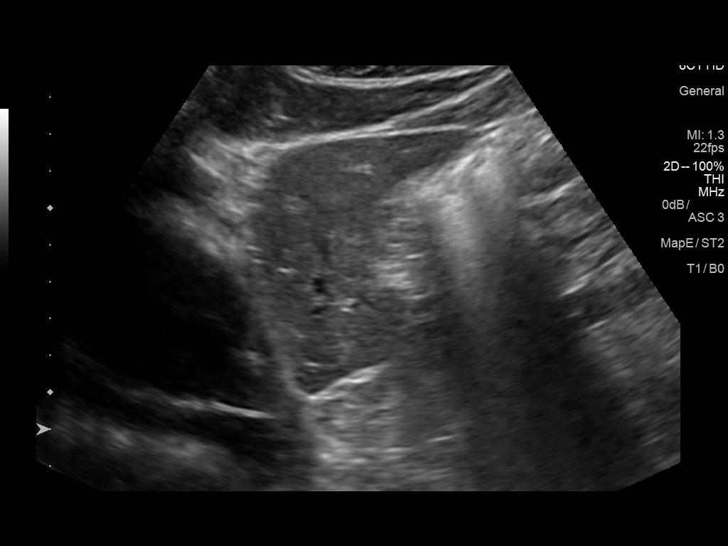
[im 13/29]
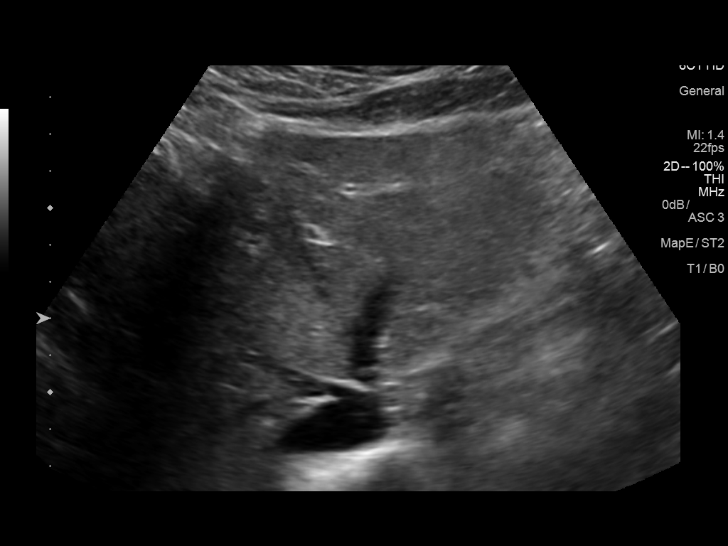
[im 16/29]
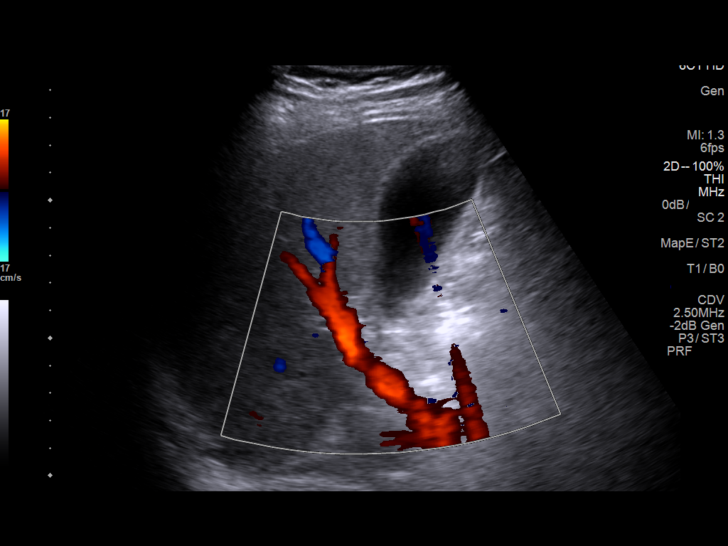
[im 18/29]
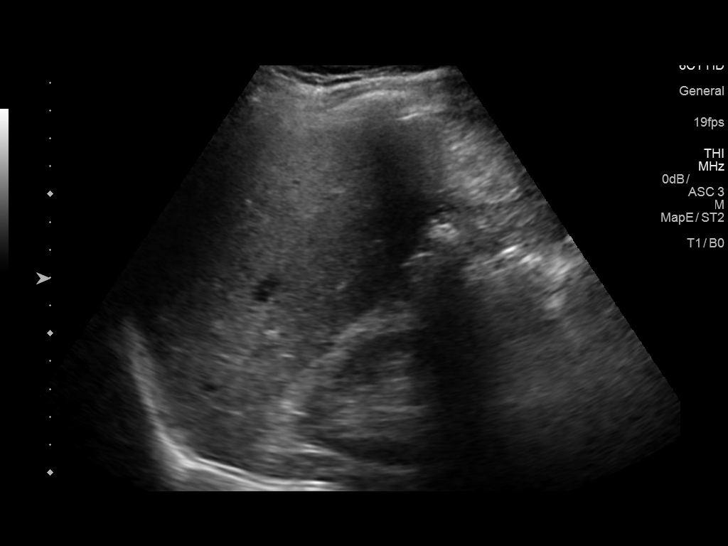
[im 19/29]
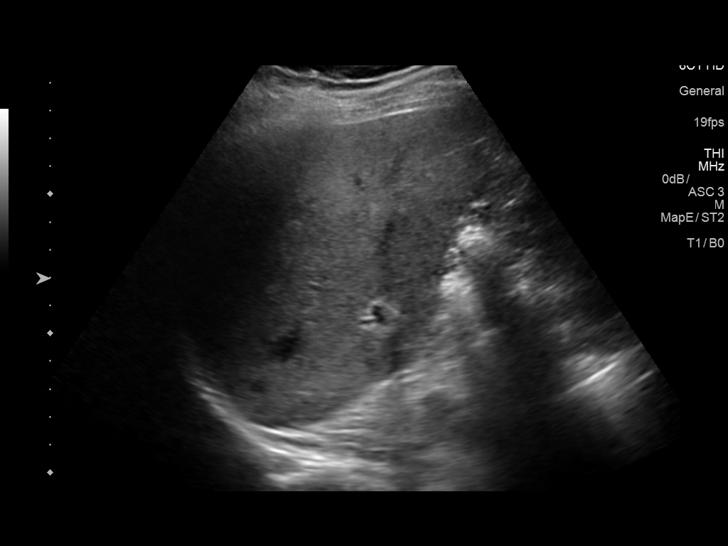
[im 22/29]
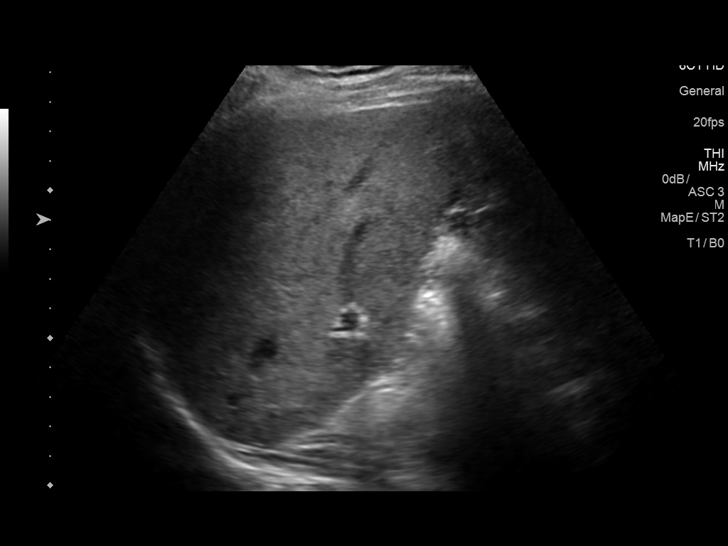
[im 24/29]
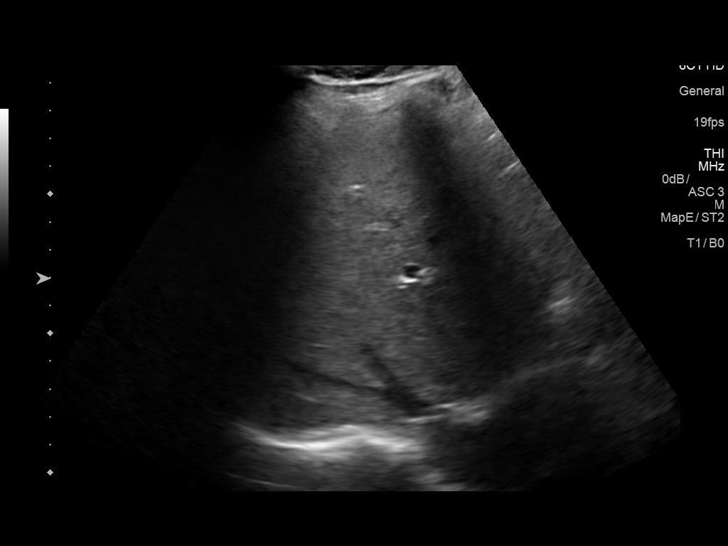
[im 26/29]
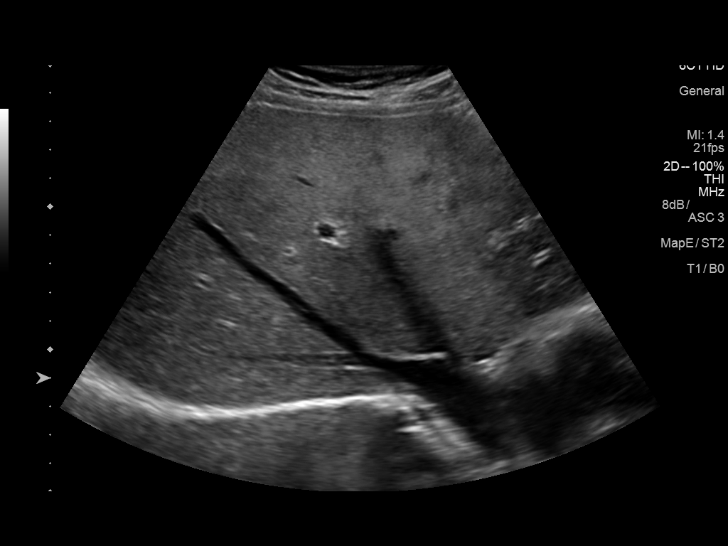
[im 29/29]
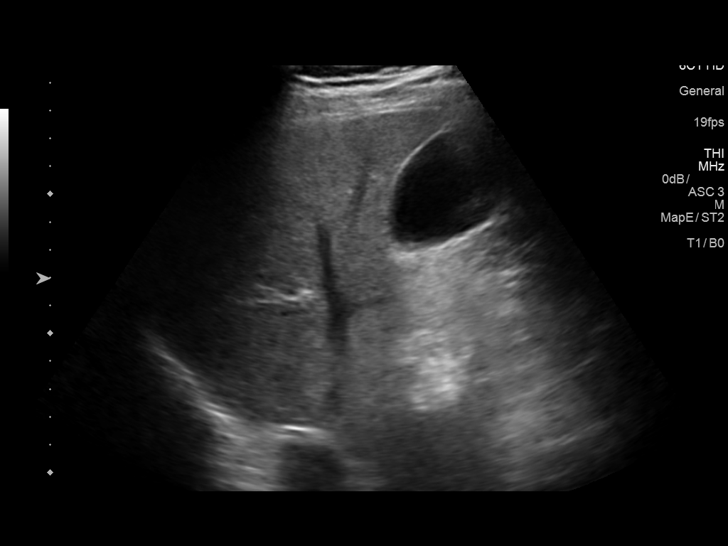

[14 of 25 positions shown; findings below may reference images not displayed]

FINDINGS: Gallbladder:

No gallstones or wall thickening visualized. No sonographic Murphy
sign noted by sonographer.

Common bile duct:

Diameter: 4 mm

Liver:

Mildly increased in echogenicity consistent with fatty infiltration.
No focal mass is seen.
IMPRESSION: Fatty liver.  No other focal abnormality is noted.

## 2018-11-06 DIAGNOSIS — Z23 Encounter for immunization: Secondary | ICD-10-CM | POA: Diagnosis not present

## 2019-03-17 DIAGNOSIS — Z23 Encounter for immunization: Secondary | ICD-10-CM | POA: Diagnosis not present

## 2019-04-14 DIAGNOSIS — Z23 Encounter for immunization: Secondary | ICD-10-CM | POA: Diagnosis not present

## 2019-04-20 DIAGNOSIS — E785 Hyperlipidemia, unspecified: Secondary | ICD-10-CM | POA: Diagnosis not present

## 2019-04-20 DIAGNOSIS — N401 Enlarged prostate with lower urinary tract symptoms: Secondary | ICD-10-CM | POA: Diagnosis not present

## 2019-04-20 DIAGNOSIS — K219 Gastro-esophageal reflux disease without esophagitis: Secondary | ICD-10-CM | POA: Diagnosis not present

## 2019-04-20 DIAGNOSIS — I10 Essential (primary) hypertension: Secondary | ICD-10-CM | POA: Diagnosis not present

## 2019-04-20 DIAGNOSIS — R799 Abnormal finding of blood chemistry, unspecified: Secondary | ICD-10-CM | POA: Diagnosis not present

## 2019-04-20 DIAGNOSIS — R350 Frequency of micturition: Secondary | ICD-10-CM | POA: Diagnosis not present

## 2019-04-20 DIAGNOSIS — I639 Cerebral infarction, unspecified: Secondary | ICD-10-CM | POA: Diagnosis not present

## 2019-04-20 DIAGNOSIS — G8929 Other chronic pain: Secondary | ICD-10-CM | POA: Diagnosis not present

## 2019-04-20 DIAGNOSIS — M545 Low back pain: Secondary | ICD-10-CM | POA: Diagnosis not present

## 2019-04-20 DIAGNOSIS — Z125 Encounter for screening for malignant neoplasm of prostate: Secondary | ICD-10-CM | POA: Diagnosis not present

## 2019-04-21 DIAGNOSIS — M5137 Other intervertebral disc degeneration, lumbosacral region: Secondary | ICD-10-CM | POA: Diagnosis not present

## 2019-04-21 DIAGNOSIS — M4317 Spondylolisthesis, lumbosacral region: Secondary | ICD-10-CM | POA: Diagnosis not present

## 2019-06-04 DIAGNOSIS — N401 Enlarged prostate with lower urinary tract symptoms: Secondary | ICD-10-CM | POA: Diagnosis not present

## 2019-06-04 DIAGNOSIS — R350 Frequency of micturition: Secondary | ICD-10-CM | POA: Diagnosis not present

## 2019-06-04 DIAGNOSIS — G8929 Other chronic pain: Secondary | ICD-10-CM | POA: Diagnosis not present

## 2019-06-04 DIAGNOSIS — M545 Low back pain: Secondary | ICD-10-CM | POA: Diagnosis not present

## 2019-06-04 DIAGNOSIS — E785 Hyperlipidemia, unspecified: Secondary | ICD-10-CM | POA: Diagnosis not present

## 2019-06-04 DIAGNOSIS — K760 Fatty (change of) liver, not elsewhere classified: Secondary | ICD-10-CM | POA: Diagnosis not present

## 2019-06-04 DIAGNOSIS — I639 Cerebral infarction, unspecified: Secondary | ICD-10-CM | POA: Diagnosis not present

## 2019-06-04 DIAGNOSIS — R413 Other amnesia: Secondary | ICD-10-CM | POA: Diagnosis not present

## 2019-06-04 DIAGNOSIS — Z0000 Encounter for general adult medical examination without abnormal findings: Secondary | ICD-10-CM | POA: Diagnosis not present

## 2019-06-04 DIAGNOSIS — I10 Essential (primary) hypertension: Secondary | ICD-10-CM | POA: Diagnosis not present

## 2019-06-04 DIAGNOSIS — K219 Gastro-esophageal reflux disease without esophagitis: Secondary | ICD-10-CM | POA: Diagnosis not present

## 2019-06-09 DIAGNOSIS — F99 Mental disorder, not otherwise specified: Secondary | ICD-10-CM | POA: Diagnosis not present

## 2019-06-28 DIAGNOSIS — R9389 Abnormal findings on diagnostic imaging of other specified body structures: Secondary | ICD-10-CM | POA: Diagnosis not present

## 2019-06-28 DIAGNOSIS — R7989 Other specified abnormal findings of blood chemistry: Secondary | ICD-10-CM | POA: Diagnosis not present

## 2019-06-28 DIAGNOSIS — R413 Other amnesia: Secondary | ICD-10-CM | POA: Diagnosis not present

## 2019-06-28 DIAGNOSIS — R7401 Elevation of levels of liver transaminase levels: Secondary | ICD-10-CM | POA: Diagnosis not present

## 2019-07-15 DIAGNOSIS — R413 Other amnesia: Secondary | ICD-10-CM | POA: Diagnosis not present

## 2019-07-15 DIAGNOSIS — R350 Frequency of micturition: Secondary | ICD-10-CM | POA: Diagnosis not present

## 2019-07-15 DIAGNOSIS — I639 Cerebral infarction, unspecified: Secondary | ICD-10-CM | POA: Diagnosis not present

## 2019-07-15 DIAGNOSIS — N401 Enlarged prostate with lower urinary tract symptoms: Secondary | ICD-10-CM | POA: Diagnosis not present

## 2019-11-30 DIAGNOSIS — Z23 Encounter for immunization: Secondary | ICD-10-CM | POA: Diagnosis not present

## 2019-12-04 DIAGNOSIS — I639 Cerebral infarction, unspecified: Secondary | ICD-10-CM | POA: Diagnosis not present

## 2019-12-04 DIAGNOSIS — E785 Hyperlipidemia, unspecified: Secondary | ICD-10-CM | POA: Diagnosis not present

## 2019-12-04 DIAGNOSIS — R799 Abnormal finding of blood chemistry, unspecified: Secondary | ICD-10-CM | POA: Diagnosis not present

## 2019-12-04 DIAGNOSIS — I10 Essential (primary) hypertension: Secondary | ICD-10-CM | POA: Diagnosis not present

## 2019-12-04 DIAGNOSIS — Z125 Encounter for screening for malignant neoplasm of prostate: Secondary | ICD-10-CM | POA: Diagnosis not present

## 2019-12-10 DIAGNOSIS — K760 Fatty (change of) liver, not elsewhere classified: Secondary | ICD-10-CM | POA: Diagnosis not present

## 2019-12-10 DIAGNOSIS — E785 Hyperlipidemia, unspecified: Secondary | ICD-10-CM | POA: Diagnosis not present

## 2019-12-10 DIAGNOSIS — Z125 Encounter for screening for malignant neoplasm of prostate: Secondary | ICD-10-CM | POA: Diagnosis not present

## 2019-12-10 DIAGNOSIS — Z0000 Encounter for general adult medical examination without abnormal findings: Secondary | ICD-10-CM | POA: Diagnosis not present

## 2019-12-10 DIAGNOSIS — R350 Frequency of micturition: Secondary | ICD-10-CM | POA: Diagnosis not present

## 2019-12-10 DIAGNOSIS — I639 Cerebral infarction, unspecified: Secondary | ICD-10-CM | POA: Diagnosis not present

## 2019-12-10 DIAGNOSIS — I10 Essential (primary) hypertension: Secondary | ICD-10-CM | POA: Diagnosis not present

## 2019-12-10 DIAGNOSIS — N401 Enlarged prostate with lower urinary tract symptoms: Secondary | ICD-10-CM | POA: Diagnosis not present
